# Patient Record
Sex: Male | Born: 1980 | Race: White | Hispanic: No | Marital: Married | Smoking: Former smoker
Health system: Southern US, Community
[De-identification: ages and names within clinical notes are randomized; demographics above are authoritative.]

## PROBLEM LIST (undated history)

## (undated) DIAGNOSIS — I10 Essential (primary) hypertension: Secondary | ICD-10-CM

## (undated) DIAGNOSIS — T7840XA Allergy, unspecified, initial encounter: Secondary | ICD-10-CM

## (undated) DIAGNOSIS — I719 Aortic aneurysm of unspecified site, without rupture: Secondary | ICD-10-CM

## (undated) DIAGNOSIS — F419 Anxiety disorder, unspecified: Secondary | ICD-10-CM

## (undated) DIAGNOSIS — L309 Dermatitis, unspecified: Secondary | ICD-10-CM

## (undated) HISTORY — DX: Dermatitis, unspecified: L30.9

## (undated) HISTORY — DX: Aortic aneurysm of unspecified site, without rupture: I71.9

## (undated) HISTORY — PX: HERNIA REPAIR: SHX51

## (undated) HISTORY — DX: Allergy, unspecified, initial encounter: T78.40XA

---

## 2009-08-10 ENCOUNTER — Emergency Department (HOSPITAL_COMMUNITY): Admission: EM | Admit: 2009-08-10 | Discharge: 2009-08-10 | Payer: Self-pay | Admitting: Emergency Medicine

## 2009-11-04 ENCOUNTER — Encounter (INDEPENDENT_AMBULATORY_CARE_PROVIDER_SITE_OTHER): Payer: Self-pay | Admitting: *Deleted

## 2009-12-28 ENCOUNTER — Encounter (INDEPENDENT_AMBULATORY_CARE_PROVIDER_SITE_OTHER): Payer: Self-pay | Admitting: *Deleted

## 2010-01-19 ENCOUNTER — Telehealth: Payer: Self-pay | Admitting: Gastroenterology

## 2010-01-19 ENCOUNTER — Ambulatory Visit: Payer: Self-pay | Admitting: Gastroenterology

## 2010-01-19 DIAGNOSIS — R112 Nausea with vomiting, unspecified: Secondary | ICD-10-CM | POA: Insufficient documentation

## 2010-01-20 LAB — CONVERTED CEMR LAB
Alkaline Phosphatase: 53 units/L (ref 39–117)
BUN: 10 mg/dL (ref 6–23)
Basophils Relative: 0.4 % (ref 0.0–3.0)
Creatinine, Ser: 0.8 mg/dL (ref 0.4–1.5)
Eosinophils Relative: 4.9 % (ref 0.0–5.0)
Glucose, Bld: 79 mg/dL (ref 70–99)
HCT: 38.4 % — ABNORMAL LOW (ref 39.0–52.0)
Hemoglobin: 13.2 g/dL (ref 13.0–17.0)
Lymphs Abs: 2.5 10*3/uL (ref 0.7–4.0)
MCV: 92 fL (ref 78.0–100.0)
Monocytes Absolute: 0.6 10*3/uL (ref 0.1–1.0)
Neutro Abs: 6.1 10*3/uL (ref 1.4–7.7)
Neutrophils Relative %: 63 % (ref 43.0–77.0)
RBC: 4.17 M/uL — ABNORMAL LOW (ref 4.22–5.81)
Sed Rate: 9 mm/hr (ref 0–22)
Total Bilirubin: 0.7 mg/dL (ref 0.3–1.2)
WBC: 9.7 10*3/uL (ref 4.5–10.5)

## 2010-01-21 ENCOUNTER — Encounter (INDEPENDENT_AMBULATORY_CARE_PROVIDER_SITE_OTHER): Payer: Self-pay | Admitting: *Deleted

## 2010-02-04 ENCOUNTER — Telehealth (INDEPENDENT_AMBULATORY_CARE_PROVIDER_SITE_OTHER): Payer: Self-pay | Admitting: *Deleted

## 2010-02-05 ENCOUNTER — Telehealth (INDEPENDENT_AMBULATORY_CARE_PROVIDER_SITE_OTHER): Payer: Self-pay | Admitting: *Deleted

## 2010-08-25 NOTE — Letter (Signed)
Summary: EGD Instructions  [facility]  [address] [place], [place] [address]   Phone: [phone]  Fax: [phone]       Chad Johnson    [birth year]    MRN: [id number]       Procedure Day /Date:02/04/10  THIURS       Arrival Time: 1030 am     Procedure Time: 1130 am     Location of Procedure:                     [facility] ( Outpatient Registration)    PREPARATION FOR ENDOSCOPY   On 02/04/10  THE DAY OF THE PROCEDURE:  1.   No solid foods, milk or milk products are allowed after midnight the night before your procedure.  2.   Do not drink anything colored red or purple.  Avoid juices with pulp.  No orange juice.  3.  You may drink clear liquids until 730 am , which is 4 hours before your procedure.                                                                                                CLEAR LIQUIDS INCLUDE: Water Jello Ice Popsicles Tea (sugar ok, no milk/cream) Powdered fruit flavored drinks Coffee (sugar ok, no milk/cream) Gatorade Juice: apple, white grape, white cranberry  Lemonade Clear bullion, consomm, broth Carbonated beverages (any kind) Strained chicken noodle soup Hard Candy   MEDICATION INSTRUCTIONS  Unless otherwise instructed, you should take regular prescription medications with a small sip of water as early as possible the morning of your procedure.          OTHER INSTRUCTIONS  You will need a responsible adult at least 31 years of age to accompany you and drive you home.   This person must remain in the waiting room during your procedure.  Wear loose fitting clothing that is easily removed.  Leave jewelry and other valuables at home.  However, you may wish to bring a book to read or an iPod/MP3 player to listen to music as you wait for your procedure to start.  Remove all body piercing jewelry and leave at home.  Total time from sign-in until discharge is approximately 2-3 hours.  You should go  home directly after your procedure and rest.  You can resume normal activities the day after your procedure.  The day of your procedure you should not:   Drive   Make legal decisions   Operate machinery   Drink alcohol   Return to work  You will receive specific instructions about eating, activities and medications before you leave.    The above instructions have been reviewed and explained to me by   Chales Abrahams CMA Duncan Dull)  January 21, 2010 2:33 PM     I fully understand and can verbalize these instructions over the phone mailed to home Date 01/21/10

## 2010-08-25 NOTE — Assessment & Plan Note (Signed)
History of Present Illness Visit Type: consult  Primary GI MD: Rob Bunting MD Primary Provider: Hadley Pen, MD Requesting Provider: Hadley Pen, MD  Chief Complaint: IBS and GERD  History of Present Illness:     very pleasant 30 year old man who has had myriad GI complaints. Alternates between loose stools, constipation.    Wakes up with LUQ pains in the morning.  He eats pretty late in the evening, will eat several times before going to bed.    He had an episode of pyrosis, then nausea, pain substernally, then vomits.  Saw some blood. The episode will last a full evening.     When very young he had a lot of vomiting,  Has taken PPI many years ago.  It never really worked.  He recently tried dexilant for two days, may have caused pains.  Never had EGD.  Sometimes dairy is not a problem, sometimes it is bad.            Current Medications (verified): 1)  None  Allergies (verified): No Known Drug Allergies  Past History:  Past Medical History: Irritable Bowel Syndrome? GERD? 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  All other review of systems was otherwise negative.   Vital Signs:  Patient profile:   30 year old male Height:      74 inches Weight:      155 pounds BMI:     19.97 BSA:     1.95 Pulse rate:   76 / minute Pulse rhythm:   regular BP sitting:   102 / 64  (left arm) Cuff size:   regular  Vitals Entered By: Ok Anis CMA (January 19, 2010 1:55 PM)  Physical Exam  Additional Exam:  Constitutional: generally well appearing, Thin Psychiatric: alert and oriented  times 3 Eyes: extraocular movements intact Mouth: oropharynx moist, no lesions Neck: supple, no lymphadenopathy Cardiovascular: heart regular rate and rythm Lungs: CTA bilaterally Abdomen: soft, non-tender, non-distended, no obvious ascites, no peritoneal signs, normal bowel sounds Extremities: no lower extremity edema bilaterally Skin: no lesions on visible extremities    Impression & Recommendations:  Problem # 1:  myriad upper and lower GI symptoms he is of [employer]. Perhaps this is all from celiac sprue. We will start with blood tests including a CBC, complete metabolic profile, celiac profile, total IgA, sedimentation rate and thyroid testing. If this suggests that he indeed has celiac sprue that he will need upper endoscopy. If it does not then I think he probably still deserves an upper endoscopy as well as a colonoscopy to explain his myriad symptoms.  Other Orders: TLB-CMP (Comprehensive Metabolic Pnl) (80053-COMP) TLB-CBC Platelet - w/Differential (85025-CBCD) TLB-IgA (Immunoglobulin A) (82784-IGA) TLB-Sedimentation Rate (ESR) (85652-ESR) T-Sprue Panel (Celiac Disease Aby Eval) (83516x3/[address])  Patient Instructions: 1)  You will get lab test(s) done today (celiac sprue panel, total IgA, esr, cbc, cmet, tsh). 2)  Will decide on next step based on the lab results. 3)  A copy of this information will be sent to Dr. Sherral Hammers. 4)  The medication list was reviewed and reconciled.  All changed / newly prescribed medications were explained.  A complete medication list was provided to the patient / caregiver.

## 2010-08-25 NOTE — Progress Notes (Signed)
  Phone Note Other Incoming   Summary of Call: Lab from downstairs called to say after having blood drawn Pt. passed out and started to vomit.Discussed with Dr.jacob's who just saw pt. in the office.Per his order lab ntfd. that pt. needs to go to Er. for evaluation. Sheri  from lab called back to say pt. is refusing to go to er. Initial call taken by: Teryl Lucy RN,  January 19, 2010 2:47 PM  Follow-up for Phone Call        ok Follow-up by: Rachael Fee MD,  January 19, 2010 2:54 PM     Appended Document:  have him come upstairs for  vitals check  Appended Document:  Pt. brought to exam room .BP 70/40 [id number].  Appended Document:  his BP was 90/40 not 70/40

## 2010-08-25 NOTE — Letter (Signed)
Summary: New Patient letter  [facility]  [address] [place], [place] [address]   Phone: [phone]  Fax: [phone]       12/28/2009 MRN: [id number]  Chad Johnson [address] [place], [place]  [address]  Dear Mr. Demelo,  Welcome to the Gastroenterology Division at [facility].    You are scheduled to see Dr.  Christella Hartigan on 01-19-10 at 2pm on the 3rd floor at [facility], [address].  We ask that you try to arrive at our office 15 minutes prior to your appointment time to allow for check-in.  We would like you to complete the enclosed self-administered evaluation form prior to your visit and bring it with you on the day of your appointment.  We will review it with you.  Also, please bring a complete list of all your medications or, if you prefer, bring the medication bottles and we will list them.  Please bring your insurance card so that we may make a copy of it.  If your insurance requires a referral to see a specialist, please bring your referral form from your primary care physician.  Co-payments are due at the time of your visit and may be paid by cash, check or credit card.     Your office visit will consist of a consult with your physician (includes a physical exam), any laboratory testing he/she may order, scheduling of any necessary diagnostic testing (e.g. x-ray, ultrasound, CT-scan), and scheduling of a procedure (e.g. Endoscopy, Colonoscopy) if required.  Please allow enough time on your schedule to allow for any/all of these possibilities.    If you cannot keep your appointment, please call [phone] to cancel or reschedule prior to your appointment date.  This allows [place] the opportunity to schedule an appointment for another patient in need of care.  If you do not cancel or reschedule by 5 p.m. the business day prior to your appointment date, you will be charged a $50.00 late cancellation/no-show fee.    Thank you for choosing  [facility] for your medical needs.  We appreciate the opportunity to care for you.  Please visit [place] at our website  to learn more about our practice.                     Sincerely,                                                             The Gastroenterology Division

## 2010-08-25 NOTE — Letter (Signed)
Summary: New Patient letter  [facility]  [address] [place], [place] [address]   Phone: [phone]  Fax: [phone]       11/04/2009 MRN: [id number]  [facility] [address] [place], [place]  [address]  Dear Chad Johnson,  Welcome to the Gastroenterology Division at [facility].    You are scheduled to see Dr.  Rob Bunting on Dec 01, 2009 at  10:00am  on the 3rd floor at [facility], [address].  We ask that you try to arrive at our office 15 minutes prior to your appointment time to allow for check-in.  We would like you to complete the enclosed self-administered evaluation form prior to your visit and bring it with you on the day of your appointment.  We will review it with you.  Also, please bring a complete list of all your medications or, if you prefer, bring the medication bottles and we will list them.  Please bring your insurance card so that we may make a copy of it.  If your insurance requires a referral to see a specialist, please bring your referral form from your primary care physician.  Co-payments are due at the time of your visit and may be paid by cash, check or credit card.     Your office visit will consist of a consult with your physician (includes a physical exam), any laboratory testing he/she may order, scheduling of any necessary diagnostic testing (e.g. x-ray, ultrasound, CT-scan), and scheduling of a procedure (e.g. Endoscopy, Colonoscopy) if required.  Please allow enough time on your schedule to allow for any/all of these possibilities.    If you cannot keep your appointment, please call [phone] to cancel or reschedule prior to your appointment date.  This allows [place] the opportunity to schedule an appointment for another patient in need of care.  If you do not cancel or reschedule by 5 p.m. the business day prior to your appointment date, you will be charged a $50.00 late cancellation/no-show fee.    Thank  you for choosing [facility] for your medical needs.  We appreciate the opportunity to care for you.  Please visit [place] at our website  to learn more about our practice.                     Sincerely,                                                             The Gastroenterology Division

## 2010-08-25 NOTE — Progress Notes (Signed)
Summary: EGD CX  Phone Note Call from Patient   Summary of Call: pt called to cx procedure he says he does not have a ride.  He will call back to reschedule.  I advised him that it is very important that he have this procedure and to call as soon as possible to reschedule.  Endo was called. Initial call taken by: Chales Abrahams CMA Duncan Dull),  February 04, 2010 10:36 AM  Follow-up for Phone Call        ok Follow-up by: Rachael Fee MD,  February 04, 2010 10:53 AM

## 2010-08-25 NOTE — Progress Notes (Signed)
Summary: LAB REMINDER  Phone Note Outgoing Call Call back at [facility] Phone [phone]   Call placed by: Chales Abrahams CMA Duncan Dull),  February 05, 2010 8:28 AM Summary of Call: PT REMINDED TO HAVE LABS AND STOOL TEST Initial call taken by: Chales Abrahams CMA Duncan Dull),  February 05, 2010 8:29 AM

## 2011-09-07 ENCOUNTER — Ambulatory Visit: Payer: Self-pay | Admitting: Family Medicine

## 2011-09-22 ENCOUNTER — Ambulatory Visit: Payer: Self-pay | Admitting: Sports Medicine

## 2011-10-03 ENCOUNTER — Encounter: Payer: Self-pay | Admitting: Family Medicine

## 2011-10-03 ENCOUNTER — Ambulatory Visit (INDEPENDENT_AMBULATORY_CARE_PROVIDER_SITE_OTHER): Payer: Self-pay | Admitting: Family Medicine

## 2011-10-03 DIAGNOSIS — Z13220 Encounter for screening for lipoid disorders: Secondary | ICD-10-CM

## 2011-10-03 DIAGNOSIS — R5383 Other fatigue: Secondary | ICD-10-CM | POA: Insufficient documentation

## 2011-10-03 DIAGNOSIS — M9981 Other biomechanical lesions of cervical region: Secondary | ICD-10-CM

## 2011-10-03 DIAGNOSIS — F411 Generalized anxiety disorder: Secondary | ICD-10-CM

## 2011-10-03 DIAGNOSIS — R5381 Other malaise: Secondary | ICD-10-CM

## 2011-10-03 DIAGNOSIS — M9901 Segmental and somatic dysfunction of cervical region: Secondary | ICD-10-CM

## 2011-10-03 DIAGNOSIS — G8921 Chronic pain due to trauma: Secondary | ICD-10-CM

## 2011-10-03 MED ORDER — HYDROXYZINE HCL 25 MG PO TABS: 25.0000 mg | ORAL_TABLET | Freq: Three times a day (TID) | ORAL | Status: DC | PRN

## 2011-10-03 MED ORDER — HYDROCODONE-ACETAMINOPHEN 5-500 MG PO TABS: 1.0000 | ORAL_TABLET | Freq: Two times a day (BID) | ORAL | Status: AC | PRN

## 2011-10-03 MED ORDER — DIAZEPAM 5 MG PO TABS: 2.5000 mg | ORAL_TABLET | Freq: Two times a day (BID) | ORAL | Status: AC | PRN

## 2011-10-03 MED ORDER — VENLAFAXINE HCL ER 37.5 MG PO CP24: ORAL_CAPSULE | ORAL | Status: DC
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
## 2011-10-03 NOTE — Patient Instructions (Signed)
Very nice to meet you. I'm giving you some hydrocodone and Valium but I hopefully will have the office in the next month or 2. I'm giving you a new medicine called Effexor. Take one pill daily for the first week and 2 pills daily thereafter I'm giving you another medicine called hydroxyzine take one pill up to 3 times a day for anxiety. I would rather use this instead of your Valium when you can. Come back with Toni Amend when you're ready to get your blood drawn. Do the exercises that I should you for your back I when she come back at her scheduled appointment.
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
## 2011-10-03 NOTE — Assessment & Plan Note (Addendum)
After verbal consent pt did have HVLA, with marked improvement.  Gave side effects to look out for and can take anti inflammatories in the acute time frame.  Patient given exercises to try and strengthen the dysfunction he has in his upper back that is causing more strain on his neck. Followup in 2 weeks.

## 2011-10-04 ENCOUNTER — Telehealth: Payer: Self-pay | Admitting: Family Medicine

## 2011-10-04 DIAGNOSIS — G8921 Chronic pain due to trauma: Secondary | ICD-10-CM

## 2011-10-04 DIAGNOSIS — F411 Generalized anxiety disorder: Secondary | ICD-10-CM

## 2011-10-04 NOTE — Telephone Encounter (Signed)
Pt states he cannot afford the Effexor and hydroxyzine - wants to know if there is anything cheaper. [facility] [address]

## 2011-10-05 MED ORDER — CITALOPRAM HYDROBROMIDE 20 MG PO TABS: 20.0000 mg | ORAL_TABLET | Freq: Every day | ORAL | Status: DC

## 2011-10-05 MED ORDER — HYDROXYZINE HCL 25 MG PO TABS: 25.0000 mg | ORAL_TABLET | Freq: Three times a day (TID) | ORAL | Status: AC | PRN

## 2011-10-05 NOTE — Telephone Encounter (Signed)
Hydroxyzine should be $4 so I can not get cheaper than that. I will switch from Effexor to Celexa 20 mg. Please call patient to him to take it daily thank you

## 2011-10-05 NOTE — Telephone Encounter (Signed)
Patient informed that effexor had been switched to celexa, and patient states that hydroxyzine was not $4 at [facility] informed him I would send it to [facility]. Patient expressed understanding.

## 2011-10-06 NOTE — Telephone Encounter (Signed)
Spoke with [employer] @ [facility] [address].  States that hydroxyzine is not on the $4 list.  But she did have a discount card that if applicable would bring the price down to 21.89.  Spoke with pt, he is agreeable to this, will go pick up. Nichlas Pitera, Maryjo [place]

## 2011-10-06 NOTE — Telephone Encounter (Signed)
He went to pick up Hydroxyzine and it was almost $50 - not sure what he should do.  He has to pay out of pocket for all his meds.

## 2011-10-14 ENCOUNTER — Ambulatory Visit: Payer: Self-pay | Admitting: Family Medicine

## 2011-10-24 ENCOUNTER — Encounter: Payer: Self-pay | Admitting: Family Medicine

## 2011-10-24 ENCOUNTER — Ambulatory Visit (INDEPENDENT_AMBULATORY_CARE_PROVIDER_SITE_OTHER): Payer: Self-pay | Admitting: Family Medicine

## 2011-10-24 VITALS — BP 108/72 | HR 70 | Temp 98.2°F | Ht 72.0 in | Wt 182.0 lb

## 2011-10-24 DIAGNOSIS — M899 Disorder of bone, unspecified: Secondary | ICD-10-CM | POA: Insufficient documentation

## 2011-10-24 DIAGNOSIS — M9901 Segmental and somatic dysfunction of cervical region: Secondary | ICD-10-CM

## 2011-10-24 DIAGNOSIS — F411 Generalized anxiety disorder: Secondary | ICD-10-CM

## 2011-10-24 DIAGNOSIS — M9981 Other biomechanical lesions of cervical region: Secondary | ICD-10-CM

## 2011-10-24 DIAGNOSIS — G8921 Chronic pain due to trauma: Secondary | ICD-10-CM

## 2011-10-24 MED ORDER — DIAZEPAM 5 MG PO TABS: 5.0000 mg | ORAL_TABLET | Freq: Two times a day (BID) | ORAL | Status: DC | PRN

## 2011-10-24 MED ORDER — FLUOXETINE HCL (PMDD) 10 MG PO TABS: 1.0000 | ORAL_TABLET | Freq: Every day | ORAL | Status: DC

## 2011-10-24 MED ORDER — CYCLOBENZAPRINE HCL 5 MG PO TABS: 5.0000 mg | ORAL_TABLET | Freq: Three times a day (TID) | ORAL | Status: DC | PRN

## 2011-10-24 MED ORDER — TRAMADOL HCL 50 MG PO TABS: 50.0000 mg | ORAL_TABLET | Freq: Three times a day (TID) | ORAL | Status: AC | PRN

## 2011-10-24 NOTE — Assessment & Plan Note (Signed)
Patient's underlying general anxiety disorder is still causing patient has significant amount of muscle skeletal complaints. Patient does respond to manipulation and we'll continue. In addition to this we will start fluoxetine give Flexeril as needed. Patient will be put on Valium but we'll not give Vicodin instead use tramadol. Patient will followup in 3-4 weeks.
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
## 2011-10-24 NOTE — Assessment & Plan Note (Signed)
As stated above, we will start fluoxetine and we will also use hydroxyzine half a tab for his anxiety. Refilled his Valium but we'll have patient try to take 7.5 mg daily instead of 10 and see if we can continue to decrease the amount. We have a goal of trying to get patient off of Valium by July.

## 2011-10-24 NOTE — Assessment & Plan Note (Signed)
Decision today to treat with OMT was based on Physical Exam  After verbal consent patient was treated with HVLA and articular techniques in cervical and thoracic areas  Patient tolerated the procedure well with improvement in symptoms  Patient given exercises, stretches and lifestyle modifications patient given exercises for scapular dysfunction as well.  See medications in patient instructions if given  Patient will follow up in 4 weeks

## 2011-10-24 NOTE — Patient Instructions (Signed)
Good to see you. I have refilled your medications. We will start a new medicine called fluoxetine. Take one tablet daily. You can start this after finals if you want. I'm giving you some tramadol to try for pain and Valium but I when she to try to decrease the Valium to 7.5 mg daily I'm giving you Flexeril for muscle spasms. I when she to come back in 3-4 weeks to make sure you're improving.    Scapular Winging  with Rehab  Scapular winging syndrome is also known as serratus anterior palsy or long thoracic nerve injury. The condition is an uncommon injury to the nervous system. The condition is caused by injury to the long thoracic nerve that runs through the neck and shoulder. Injury to the shoulder, such as a fall or repetitive stress on the shoulder causes the nerve to become stretched. Occasionally the injury is the result of an infection of the nerve. Damage to the long thoracic nerve results in weakness of the serratus anterior muscle. The serratus anterior muscle is responsible for controlling the shoulder blade (scapula). Weakness in this muscle results in a instability (winging) of the scapula. SYMPTOMS   Pain and weakness in the shoulder (usually the back of the shoulder) that is often diffuse or unable to localize.   Loss of or decrease in shoulder function.   Upper back pain while sitting, due to the scapula pressing on the back of the chair.   Visible deformity in the back of the shoulder.  CAUSES  Scapular winging is caused by stretching of the long thoracic nerve. Common mechanisms of injury include:  Viral illness.   Repetitive and/or stressful use of the shoulder.   Falling onto the shoulder with the head and neck stretched away from the shoulder.  RISK INCREASES WITH:  Contact sports (football, rugby, lacrosse, or soccer).   Activities involving overhead arm movement (baseball, volleyball, or racquet sports).   Poor strength and flexibility.  PREVENTION  Warm up  and stretch properly before activity.   Allow for adequate recovery between workouts.   Maintain physical fitness:   Strength, flexibility, and endurance.   Cardiovascular fitness.   Learn and use proper technique. When possible, have a coach correct improper technique.  PROGNOSIS  Scapular winging normally resolves spontaneously within 18 months. In rare circumstances surgery is recommended.  RELATED COMPLICATIONS   Permanent nerve damage, including pain, numbness, tingle, or weakness.   Shoulder weakness.   Recurrent shoulder pain.   Inability to compete in athletics.  TREATMENT Treatment initially involves resting from any activities that aggravate your symptoms. The use of ice and medication may help reduce pain and inflammation. The use of strengthening and stretching exercises may help reduce pain with activity, specifically shoulder exercises that improve range of motion. These exercises may be performed at home or with referral to a therapist. If symptoms persist for greater than 6 months despite non-surgical (conservative) treatment, then surgery may be recommended. Surgery is only used for the most serious cases and the purpose is to regain function, not to allow an athlete to return to sports. MEDICATION   If pain medication is necessary, then nonsteroidal anti-inflammatory medications, such as aspirin and ibuprofen, or other minor pain relievers, such as acetaminophen, are often recommended.   Do not take pain medication for 7 days before surgery.   Prescription pain relievers may be given if deemed necessary by your caregiver. Use only as directed and only as much as you need.  HEAT AND  COLD  Cold treatment (icing) relieves pain and reduces inflammation. Cold treatment should be applied for 10 to 15 minutes every 2 to 3 hours for inflammation and pain and immediately after any activity that aggravates your symptoms. Use ice packs or massage the area with a piece of ice  (ice massage).   Heat treatment may be used prior to performing the stretching and strengthening activities prescribed by your caregiver, physical therapist, or athletic trainer. Use a heat pack or soak the injury in warm water.  SEEK MEDICAL CARE IF:  Treatment seems to offer no benefit, or the condition worsens.   Any medications produce adverse side effects.  EXERCISES  RANGE OF MOTION (ROM) AND STRETCHING EXERCISES - Scapular Winging (Serratus Anterior Palsy, Long Thoracic Nerve Injury)  These exercises may help you when beginning to rehabilitate your injury. Your symptoms may resolve with or without further involvement from your physician, physical therapist or athletic trainer. While completing these exercises, remember:   Restoring tissue flexibility helps normal motion to return to the joints. This allows healthier, less painful movement and activity.   An effective stretch should be held for at least 30 seconds.   A stretch should never be painful. You should only feel a gentle lengthening or release in the stretched tissue.  ROM - Pendulum  Bend at the waist so that your right / left arm falls away from your body. Support yourself with your opposite hand on a solid surface, such as a table or a countertop.   Your right / left arm should be perpendicular to the ground. If it is not perpendicular, you need to lean over farther. Relax the muscles in your right / left arm and shoulder as much as possible.   Gently sway your hips and trunk so they move your right / left arm without any use of your right / left shoulder muscles.   Progress your movements so that your right / left arm moves side to side, then forward and backward, and finally, both clockwise and counterclockwise.   Complete __________ repetitions in each direction. Many people use this exercise to relieve discomfort in their shoulder as well as to gain range of motion.  Repeat __________ times. Complete this exercise  __________ times per day. STRETCH - Flexion, Seated   Sit in a firm chair so that your right / left forearm can rest on a table or on a table or countertop. Your right / left elbow should rest below the height of your shoulder so that your shoulder feels supported and not tense or uncomfortable.   Keeping your right / left shoulder relaxed, lean forward at your waist, allowing your right / left hand to slide forward. Bend forward until you feel a moderate stretch in your shoulder, but before you feel an increase in your pain.   Hold __________ seconds. Slowly return to your starting position.  Repeat __________ times. Complete this exercise __________ times per day.  STRETCH - Flexion, Standing  Stand with good posture. With an underhand grip on your right / left and an overhand grip on the opposite hand, grasp a broomstick or cane so that your hands are a little more than shoulder-width apart.   Keeping your right / left elbow straight and shoulder muscles relaxed, push the stick with your opposite hand to raise your right / left arm in front of your body and then overhead. Raise your arm until you feel a stretch in your right / left shoulder, but  before you have increased shoulder pain.   Avoid shrugging your right / left shoulder as your arm rises by keeping your shoulder blade tucked down and toward your mid-back spine. Hold __________ seconds.   Slowly return to the starting position.  Repeat __________ times. Complete this exercise __________ times per day. STRETCH - Abduction, Supine  Stand with good posture. With an underhand grip on your right / left and an overhand grip on the opposite hand, grasp a broomstick or cane so that your hands are a little more than shoulder-width apart.   Keeping your right / left elbow straight and shoulder muscles relaxed, push the stick with your opposite hand to raise your right / left arm out to the side of your body and then overhead. Raise your arm  until you feel a stretch in your right / left shoulder, but before you have increased shoulder pain.   Avoid shrugging your right / left shoulder as your arm rises by keeping your shoulder blade tucked down and toward your mid-back spine. Hold __________ seconds.   Slowly return to the starting position.  Repeat __________ times. Complete this exercise __________ times per day. ROM - Flexion, Active-Assisted  Lie on your back. You may bend your knees for comfort.   Grasp a broomstick or cane so your hands are about shoulder-width apart. Your right / left hand should grip the end of the stick/cane so that your hand is positioned "thumbs-up," as if you were about to shake hands.   Using your healthy arm to lead, raise your right / left arm overhead until you feel a gentle stretch in your shoulder. Hold __________ seconds.   Use the stick/cane to assist in returning your right / left arm to its starting position.  Repeat __________ times. Complete this exercise __________ times per day.  STRENGTHENING EXERCISES - Scapular Winging (Serratus Anterior Palsy, Long Thoracic Nerve Injury) These exercises may help you when beginning to rehabilitate your injury. They may resolve your symptoms with or without further involvement from your physician, physical therapist or athletic trainer. While completing these exercises, remember:   Muscles can gain both the endurance and the strength needed for everyday activities through controlled exercises.   Complete these exercises as instructed by your physician, physical therapist or athletic trainer. Progress with the resistance and repetition exercises only as your caregiver advises.   You may experience muscle soreness or fatigue, but the pain or discomfort you are trying to eliminate should never worsen during these exercises. If this pain does worsen, stop and make certain you are following the directions exactly. If the pain is still present after  adjustments, discontinue the exercise until you can discuss the trouble with your clinician.   During your recovery, avoid activity or exercises which involve actions that place your injured hand or elbow above your head or behind your back or head. These positions stress the tissues which are trying to heal.  STRENGTH - Scapular Depression and Adduction   With good posture, sit on a firm chair. Supported your arms in front of you with pillows, arm rests or a table top. Have your elbows in line with the sides of your body.   Gently draw your shoulder blades down and toward your mid-back spine. Gradually increase the tension without tensing the muscles along the top of your shoulders and the back of your neck.   Hold for __________ seconds. Slowly release the tension and relax your muscles completely before completing the next  repetition.   After you have practiced this exercise, remove the arm support and complete it in standing as well as sitting.  Repeat __________ times. Complete this exercise __________ times per day.  STRENGTH - Scapular Protractors, Standing   Stand arms-length away from a wall. Place your hands on the wall, keeping your elbows straight.   Begin by dropping your shoulder blades down and toward your mid-back spine.   To strengthen your protractors, keep your shoulder blades down, but slide them forward on your rib cage. It will feel as if you are lifting the back of your rib cage away from the wall. This is a subtle motion and can be challenging to complete. Ask your clinician for further instruction if you are not sure you are doing the exercise correctly.   Hold for __________ seconds. Slowly return to the starting position, resting the muscles completely before completing the next repetition.  Repeat __________ times. Complete this exercise __________ times per day. STRENGTH - Scapular Protractors, Supine  Lie on your back on a firm surface. Extend your right / left  arm straight into the air while holding a __________ weight in your hand.   Keeping your head and back in place, lift your shoulder off the floor.   Hold __________ seconds. Slowly return to the starting position and allow your muscles to relax completely before completing the next repetition.  Repeat __________ times. Complete this exercise __________ times per day. STRENGTH - Scapular Protractors, Quadruped  Get onto your hands and knees with your shoulders directly over your hands (or as close as you comfortably can be).   Keeping your elbows locked, lift the back of your rib cage up into your shoulder blades so your mid-back rounds-out. Keep your neck muscles relaxed.   Hold this position for __________ seconds. Slowly return to the starting position and allow your muscles to relax completely before completing the next repetition.  Repeat __________ times. Complete this exercise __________ times per day.  STRENGTH - Scapular Depressors  Keeping your feet on the floor, lift your bottom from the seat and lock your elbows.   Keeping your elbows straight, allow gravity to pull your body weight down. Your shoulders will rise toward your ears.   Raise your body against gravity by drawing your shoulder blades down your back, shortening the distance between your shoulders and ears. Although your feet should always maintain contact with the floor, your feet should progressively support less body weight as you get stronger.   Hold __________ seconds. In a controlled and slow manner, lower your body weight to begin the next repetition.  Repeat __________ times. Complete this exercise __________ times per day.  STRENGTH - Shoulder Extensors, Prone  Lie on your stomach on a firm surface so that your right / left arm overhangs the edge. Rest your forehead on your opposite forearm. With your thumb facing away from your body and your elbow straight, hold a __________ weight in your hand.   Squeeze  your right / left shoulder blade to your mid-back spine and then slowly raise your arm behind you to the height of the bed.   Hold for __________ seconds. Slowly reverse the directions and return to the starting position, controlling the weight as you lower your arm.  Repeat __________ times. Complete this exercise __________ times per day.  STRENGTH - Horizontal Abductors Choose one of the two oppositions to complete this exercise. Prone: lying on stomach:  Lie on your stomach on a  firm surface so that your right / left arm overhangs the edge. Rest your forehead on your opposite forearm. With your palm facing the floor and your elbow straight, hold a __________ weight in your hand.   Squeeze your right / left shoulder blade to your mid-back spine and then slowly raise your arm to the height of the bed.   Hold for __________ seconds. Slowly reverse the directions and return to the starting position, controlling the weight as you lower your arm.  Repeat __________ times. Complete this exercise __________ times per day. Standing:  Secure a rubber exercise band/tubing so that it is at the height of your shoulders when you are either standing or sitting on a firm arm-less chair.   Grasp an end of the band/tubing in each hand and have your palms face each other. Straighten your elbows and lift your hands straight in front of you at shoulder height. Step back away from the secured end of band/tubing until it becomes tense.   Squeeze your shoulder blades together. Keeping your elbows locked and your hands at shoulder-height, bring your hands out to your side.   Hold __________ seconds. Slowly ease the tension on the band/tubing as you reverse the directions and return to the starting position.  Repeat __________ times. Complete this exercise __________ times per day. STRENGTH - Scapular Retractors  Secure a rubber exercise band/tubing so that it is at the height of your shoulders when you are  either standing or sitting on a firm arm-less chair.   With a palm-down grip, grasp an end of the band/tubing in each hand. Straighten your elbows and lift your hands straight in front of you at shoulder height. Step back away from the secured end of band/tubing until it becomes tense.   Squeezing your shoulder blades together, draw your elbows back as you bend them. Keep your upper arm lifted away from your body throughout the exercise.   Hold __________ seconds. Slowly ease the tension on the band/tubing as you reverse the directions and return to the starting position.  Repeat __________ times. Complete this exercise __________ times per day. STRENGTH - Shoulder Extensors   Secure a rubber exercise band/tubing so that it is at the height of your shoulders when you are either standing or sitting on a firm arm-less chair.   With a thumbs-up grip, grasp an end of the band/tubing in each hand. Straighten your elbows and lift your hands straight in front of you at shoulder height. Step back away from the secured end of band/tubing until it becomes tense.   Squeezing your shoulder blades together, pull your hands down to the sides of your thighs. Do not allow your hands to go behind you.   Hold for __________ seconds. Slowly ease the tension on the band/tubing as you reverse the directions and return to the starting position.  Repeat __________ times. Complete this exercise __________ times per day.  STRENGTH - Scapular Retractors and External Rotators  Secure a rubber exercise band/tubing so that it is at the height of your shoulders when you are either standing or sitting on a firm arm-less chair.   With a palm-down grip, grasp an end of the band/tubing in each hand. Bend your elbows 90 degrees and lift your elbows to shoulder height at your sides. Step back away from the secured end of band/tubing until it becomes tense.   Squeezing your shoulder blades together, rotate your shoulder so that  your upper arm and elbow remain  stationary, but your fists travel upward to head-height.   Hold __________ for seconds. Slowly ease the tension on the band/tubing as you reverse the directions and return to the starting position.  Repeat __________ times. Complete this exercise __________ times per day.  STRENGTH - Scapular Retractors and External Rotators, Rowing  Secure a rubber exercise band/tubing so that it is at the height of your shoulders when you are either standing or sitting on a firm arm-less chair.   With a palm-down grip, grasp an end of the band/tubing in each hand. Straighten your elbows and lift your hands straight in front of you at shoulder height. Step back away from the secured end of band/tubing until it becomes tense.   Step 1: Squeeze your shoulder blades together. Bending your elbows, draw your hands to your chest as if you are rowing a boat. At the end of this motion, your hands and elbow should be at shoulder-height and your elbows should be out to your sides.   Step 2: Rotate your shoulder to raise your hands above your head. Your forearms should be vertical and your upper-arms should be horizontal.   Hold for __________ seconds. Slowly ease the tension on the band/tubing as you reverse the directions and return to the starting position.  Repeat __________ times. Complete this exercise __________ times per day.  STRENGTH - Scapular Retractors and Elevators  Secure a rubber exercise band/tubing so that it is at the height of your shoulders when you are either standing or sitting on a firm arm-less chair.   With a thumbs-up grip, grasp an end of the band/tubing in each hand. Step back away from the secured end of band/tubing until it becomes tense.   Squeezing your shoulder blades together, straighten your elbows and lift your hands straight over your head.   Hold for __________ seconds. Slowly ease the tension on the band/tubing as you reverse the directions and  return to the starting position.  Repeat __________ times. Complete this exercise __________ times per day.  Document Released: 07/11/2005 Document Revised: 06/30/2011 Document Reviewed: 10/23/2008 [facility] Patient Information 2012 [place], [place].

## 2011-11-25 ENCOUNTER — Other Ambulatory Visit: Payer: Self-pay | Admitting: Family Medicine

## 2011-11-25 NOTE — Telephone Encounter (Signed)
He will need to wait, he was supposed to follow up with me 1 month after his appointment.  I am sorry it is our policy.

## 2011-11-25 NOTE — Telephone Encounter (Signed)
Pt sched appt for f/u on 5/22 - he is out of Diazapam and needs before appt - [facility]- [place].

## 2011-11-28 NOTE — Telephone Encounter (Signed)
Called, no answer and no machine.  Will await call back. Chad Johnson, Chad Johnson

## 2011-12-14 ENCOUNTER — Ambulatory Visit (INDEPENDENT_AMBULATORY_CARE_PROVIDER_SITE_OTHER): Payer: Self-pay | Admitting: Family Medicine

## 2011-12-14 ENCOUNTER — Encounter: Payer: Self-pay | Admitting: Family Medicine

## 2011-12-14 VITALS — BP 103/65 | HR 66 | Temp 98.4°F | Ht 74.0 in | Wt 177.0 lb

## 2011-12-14 DIAGNOSIS — G8921 Chronic pain due to trauma: Secondary | ICD-10-CM

## 2011-12-14 DIAGNOSIS — F411 Generalized anxiety disorder: Secondary | ICD-10-CM

## 2011-12-14 MED ORDER — DIAZEPAM 5 MG PO TABS: 5.0000 mg | ORAL_TABLET | Freq: Two times a day (BID) | ORAL | Status: AC | PRN

## 2011-12-14 MED ORDER — CYCLOBENZAPRINE HCL 5 MG PO TABS: 5.0000 mg | ORAL_TABLET | Freq: Three times a day (TID) | ORAL | Status: DC | PRN

## 2011-12-14 MED ORDER — PREDNISONE 50 MG PO TABS: ORAL_TABLET | ORAL | Status: AC

## 2011-12-14 MED ORDER — IBUPROFEN 600 MG PO TABS: ORAL_TABLET | ORAL | Status: DC

## 2011-12-14 MED ORDER — NORTRIPTYLINE HCL 10 MG PO CAPS: ORAL_CAPSULE | ORAL | Status: DC

## 2011-12-14 NOTE — Assessment & Plan Note (Signed)
Patient's was given Valium again one more time but decreased from 7.5 mg down to 5 mg 2 times a day. We have the plan of decreasing down to 2.5 mg twice a day after this. Patient states he understands this but does not think he will be able to do this. I'm concerned that patient is becoming dependent on this medication. Do think the generalized anxiety disorder and possible depression is causing most problems.

## 2011-12-14 NOTE — Progress Notes (Signed)
  Subjective:    Patient ID: Chad Johnson, male    DOB: 02-06-1981, 31 y.o.   MRN: [id number]  HPI 31 year old male coming back for chronic pain followup. Patient states that he is still having significant amount of pain. Patient has not been doing his exercises on a regular basis and has been doing more activities such as helping a friend move. Patient has not been able to work though he states is secondary to his pain. Patient still states that his pain is mostly in the thoracic lumbar region no radiation down the legs no bowel or bladder incontinence. Patient states that it's worse than usual. Patient states that the Valium does help some but the tramadol does not help at all.  Patient also has generalized anxiety disorder. Patient has been tried on Effexor and citalopram and both had side effects. Patient was given fluoxetine recently any try for 3 days and stopped because it was making him feel too down. Patient denies any suicidal or homicidal ideation but he states that he is a reck. Patient also makes the comment that if I had lived is 30 years I would be a reck as well but he did not want to elaborate.   Review of Systems Denies fever, chills, nausea vomiting abdominal pain, dysuria, chest pain, shortness of breath dyspnea on exertion or numbness in extremities     Objective:   Physical Exam Constitutional: He is oriented to person, place, and time. He appears well-developed.  Eyes: EOM are normal. Pupils are equal, round, and reactive to light.  Neck: Normal range of motion. Neck supple. No thyromegaly present.  Cardiovascular: Normal rate, regular rhythm and normal heart sounds. No murmur heard. Pulmonary/Chest: Effort normal and breath sounds normal.  Abdominal: Soft. Bowel sounds are normal. There is no tenderness.  Neurological: He is alert and oriented to person, place, and time.  Skin: Skin is warm and dry.   OMT Findings: Cervical: C2 rotated and side bent left in  flexion, C5 flex rotated and side bent right Thoracic: T5 extended rotated and side bent right Lumbar: L4 flexed rotated and side bent left Sacrum: Left on left    Assessment & Plan:

## 2011-12-14 NOTE — Patient Instructions (Signed)
I'm giving you a new pill to take at night. The name of it is nortriptyline to make it tired but otherwise should have no other side effects. I refilled your Valium and we will decrease it to 5 mg. I'm hoping we can go down to 2.5 at next visit. I am giving you some ibuprofen. I want you to take one pill 3 times a day for the next 5 days. I'm also giving you prednisone. Take one pill daily for 5 days. I encourage you to do your exercises on a daily basis. If we are still having trouble I think we should send you to physical therapy at your followup.

## 2011-12-14 NOTE — Assessment & Plan Note (Addendum)
Patient continues to have problems I think it's more likely a low related to her depression. We will start nortriptyline and trying hopefully patient will have less side effects of the medication he is taking when he goes to bed. Followup closely in 3 weeks. Gave patient red flags and when to seek medical attention. After verbal consent patient did have some HVLA with minimal improvement. Encourage patient to do daily exercises. Since his at followup he is still having trouble I would like to send him to physical therapy and back school.

## 2011-12-30 ENCOUNTER — Ambulatory Visit: Payer: Self-pay | Admitting: Family Medicine

## 2012-03-06 ENCOUNTER — Encounter: Payer: Self-pay | Admitting: Family Medicine

## 2012-03-06 ENCOUNTER — Ambulatory Visit (INDEPENDENT_AMBULATORY_CARE_PROVIDER_SITE_OTHER): Payer: Self-pay | Admitting: Family Medicine

## 2012-03-06 VITALS — BP 126/79 | HR 83 | Temp 98.7°F | Ht 72.0 in | Wt 166.0 lb

## 2012-03-06 DIAGNOSIS — G8921 Chronic pain due to trauma: Secondary | ICD-10-CM

## 2012-03-06 DIAGNOSIS — F411 Generalized anxiety disorder: Secondary | ICD-10-CM

## 2012-03-06 MED ORDER — MELOXICAM 15 MG PO TABS: 15.0000 mg | ORAL_TABLET | Freq: Every day | ORAL | Status: DC

## 2012-03-06 NOTE — Patient Instructions (Signed)
Nice to meet you today. We would like you to work on obtaining the [employer] so we can refer you to physical therapy. I will send a prescription for antiinflamatory called meloxicam for your neck pain. I would like you to try to set up an appointment with our Behavioral Health clinic for your anxiety. Thanks for your visit.  Back Pain, Adult Low back pain is very common. About 1 in 5 people have back pain.The cause of low back pain is rarely dangerous. The pain often gets better over time.About half of people with a sudden onset of back pain feel better in just 2 weeks. About 8 in 10 people feel better by 6 weeks.  CAUSES Some common causes of back pain include:  Strain of the muscles or ligaments supporting the spine.   Wear and tear (degeneration) of the spinal discs.   Arthritis.   Direct injury to the back.  DIAGNOSIS Most of the time, the direct cause of low back pain is not known.However, back pain can be treated effectively even when the exact cause of the pain is unknown.Answering your caregiver's questions about your overall health and symptoms is one of the most accurate ways to make sure the cause of your pain is not dangerous. If your caregiver needs more information, he or she may order lab work or imaging tests (X-rays or MRIs).However, even if imaging tests show changes in your back, this usually does not require surgery. HOME CARE INSTRUCTIONS For many people, back pain returns.Since low back pain is rarely dangerous, it is often a condition that people can learn to [facility] their own.   Remain active. It is stressful on the back to sit or stand in one place. Do not sit, drive, or stand in one place for more than 30 minutes at a time. Take short walks on level surfaces as soon as pain allows.Try to increase the length of time you walk each day.   Do not stay in bed.Resting more than 1 or 2 days can delay your recovery.   Do not avoid exercise or work.Your  body is made to move.It is not dangerous to be active, even though your back may hurt.Your back will likely heal faster if you return to being active before your pain is gone.   Pay attention to your body when you bend and lift. Many people have less discomfortwhen lifting if they bend their knees, keep the load close to their bodies,and avoid twisting. Often, the most comfortable positions are those that put less stress on your recovering back.   Find a comfortable position to sleep. Use a firm mattress and lie on your side with your knees slightly bent. If you lie on your back, put a pillow under your knees.   Only take over-the-counter or prescription medicines as directed by your caregiver. Over-the-counter medicines to reduce pain and inflammation are often the most helpful.Your caregiver may prescribe muscle relaxant drugs.These medicines help dull your pain so you can more quickly return to your normal activities and healthy exercise.   Put ice on the injured area.   Put ice in a plastic bag.   Place a towel between your skin and the bag.   Leave the ice on for 15 to 20 minutes, 3 to 4 times a day for the first 2 to 3 days. After that, ice and heat may be alternated to reduce pain and spasms.   Ask your caregiver about trying back exercises and gentle massage.  This may be of some benefit.   Avoid feeling anxious or stressed.Stress increases muscle tension and can worsen back pain.It is important to recognize when you are anxious or stressed and learn ways to manage it.Exercise is a great option.  SEEK MEDICAL CARE IF:  You have pain that is not relieved with rest or medicine.   You have pain that does not improve in 1 week.   You have new symptoms.   You are generally not feeling well.  SEEK IMMEDIATE MEDICAL CARE IF:   You have pain that radiates from your back into your legs.   You develop new bowel or bladder control problems.   You have unusual weakness or  numbness in your arms or legs.   You develop nausea or vomiting.   You develop abdominal pain.   You feel faint.  Document Released: 07/11/2005 Document Revised: 06/30/2011 Document Reviewed: 11/29/2010 [facility] Patient Information 2012 [place], [place].  Anxiety and Panic Attacks Your caregiver has informed you that you are having an anxiety or panic attack. There may be many forms of this. Most of the time these attacks come suddenly and without warning. They come at any time of day, including periods of sleep, and at any time of life. They may be strong and unexplained. Although panic attacks are very scary, they are physically harmless. Sometimes the cause of your anxiety is not known. Anxiety is a protective mechanism of the body in its fight or flight mechanism. Most of these perceived danger situations are actually nonphysical situations (such as anxiety over losing a job). CAUSES  The causes of an anxiety or panic attack are many. Panic attacks may occur in otherwise healthy people given a certain set of circumstances. There may be a genetic cause for panic attacks. Some medications may also have anxiety as a side effect. SYMPTOMS  Some of the most common feelings are:  Intense terror.   Dizziness, feeling faint.   Hot and cold flashes.   Fear of going crazy.   Feelings that nothing is real.   Sweating.   Shaking.   Chest pain or a fast heartbeat (palpitations).   Smothering, choking sensations.   Feelings of impending doom and that death is near.   Tingling of extremities, this may be from over-breathing.   Altered reality (derealization).   Being detached from yourself (depersonalization).  Several symptoms can be present to make up anxiety or panic attacks. DIAGNOSIS  The evaluation by your caregiver will depend on the type of symptoms you are experiencing. The diagnosis of anxiety or panic attack is made when no physical illness can be determined to be a cause  of the symptoms. TREATMENT  Treatment to prevent anxiety and panic attacks may include:  Avoidance of circumstances that cause anxiety.   Reassurance and relaxation.   Regular exercise.   Relaxation therapies, such as yoga.   Psychotherapy with a psychiatrist or therapist.   Avoidance of caffeine, alcohol and illegal drugs.   Prescribed medication.  SEEK IMMEDIATE MEDICAL CARE IF:   You experience panic attack symptoms that are different than your usual symptoms.   You have any worsening or concerning symptoms.  Document Released: 07/11/2005 Document Revised: 06/30/2011 Document Reviewed: 11/12/2009 [facility] Patient Information 2012 [place], [place].
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
## 2012-03-06 NOTE — Assessment & Plan Note (Signed)
Patient complains of exacerbation of anxiety disorder related to issues with fiance and her ex-husband.  States currently not taking any medications for this issue.  Offered patient zoloft to treat GAD and he refused saying that this previously made him feel poorly and he wouldn't take it.  Told patient that we would not prescribe valium for this issue and patient states he might just have to find another doctor that could take care of him.  Discussed setting an appointment up with behavioral health clinic.  Patient said this would be something to consider and he would think about it.

## 2012-03-12 ENCOUNTER — Emergency Department (HOSPITAL_COMMUNITY)
Admission: EM | Admit: 2012-03-12 | Discharge: 2012-03-12 | Disposition: A | Discharge status: Home / Self Care | Payer: Self-pay | Attending: Emergency Medicine | Admitting: Emergency Medicine

## 2012-03-12 ENCOUNTER — Emergency Department (HOSPITAL_COMMUNITY): Payer: Self-pay

## 2012-03-12 ENCOUNTER — Encounter (HOSPITAL_COMMUNITY): Payer: Self-pay | Admitting: *Deleted

## 2012-03-12 DIAGNOSIS — E876 Hypokalemia: Secondary | ICD-10-CM | POA: Insufficient documentation

## 2012-03-12 DIAGNOSIS — Z79899 Other long term (current) drug therapy: Secondary | ICD-10-CM | POA: Insufficient documentation

## 2012-03-12 DIAGNOSIS — Z9109 Other allergy status, other than to drugs and biological substances: Secondary | ICD-10-CM | POA: Insufficient documentation

## 2012-03-12 DIAGNOSIS — R109 Unspecified abdominal pain: Secondary | ICD-10-CM | POA: Insufficient documentation

## 2012-03-12 LAB — URINALYSIS, ROUTINE W REFLEX MICROSCOPIC
Glucose, UA: NEGATIVE mg/dL
Leukocytes, UA: NEGATIVE
Protein, ur: NEGATIVE mg/dL
Specific Gravity, Urine: 1.016 (ref 1.005–1.030)

## 2012-03-12 LAB — CBC WITH DIFFERENTIAL/PLATELET
Basophils Absolute: 0 10*3/uL (ref 0.0–0.1)
Basophils Relative: 0 % (ref 0–1)
Eosinophils Absolute: 0.2 10*3/uL (ref 0.0–0.7)
Eosinophils Relative: 1 % (ref 0–5)
HCT: 43 % (ref 39.0–52.0)
MCHC: 36 g/dL (ref 30.0–36.0)
MCV: 87.8 fL (ref 78.0–100.0)
Monocytes Absolute: 0.5 10*3/uL (ref 0.1–1.0)
Platelets: 384 10*3/uL (ref 150–400)
RDW: 13.4 % (ref 11.5–15.5)
WBC: 13.7 10*3/uL — ABNORMAL HIGH (ref 4.0–10.5)

## 2012-03-12 LAB — COMPREHENSIVE METABOLIC PANEL
ALT: 10 U/L (ref 0–53)
AST: 15 U/L (ref 0–37)
Albumin: 5.1 g/dL (ref 3.5–5.2)
Calcium: 10 mg/dL (ref 8.4–10.5)
Creatinine, Ser: 0.75 mg/dL (ref 0.50–1.35)
GFR calc non Af Amer: >90 mL/min (ref >90)
Sodium: 135 mEq/L (ref 135–145)
Total Protein: 7.9 g/dL (ref 6.0–8.3)

## 2012-03-12 MED ORDER — ONDANSETRON HCL 4 MG PO TABS: 4.0000 mg | ORAL_TABLET | Freq: Four times a day (QID) | ORAL | Status: AC

## 2012-03-12 MED ORDER — HYDROMORPHONE HCL PF 1 MG/ML IJ SOLN
1.0000 mg | Freq: Once | INTRAMUSCULAR | Status: AC
  Administered 2012-03-12: 1 mg via INTRAVENOUS
  Filled 2012-03-12: qty 1

## 2012-03-12 MED ORDER — HYDROCODONE-ACETAMINOPHEN 5-325 MG PO TABS: 1.0000 | ORAL_TABLET | ORAL | Status: AC | PRN

## 2012-03-12 MED ORDER — IOHEXOL 300 MG/ML  SOLN
100.0000 mL | Freq: Once | INTRAMUSCULAR | Status: AC | PRN
  Administered 2012-03-12: 100 mL via INTRAVENOUS

## 2012-03-12 MED ORDER — MORPHINE SULFATE 4 MG/ML IJ SOLN
4.0000 mg | Freq: Once | INTRAMUSCULAR | Status: AC
  Administered 2012-03-12: 4 mg via INTRAVENOUS
  Filled 2012-03-12: qty 1

## 2012-03-12 MED ORDER — DICYCLOMINE HCL 20 MG PO TABS: 20.0000 mg | ORAL_TABLET | Freq: Two times a day (BID) | ORAL | Status: DC

## 2012-03-12 MED ORDER — POTASSIUM CHLORIDE 20 MEQ/15ML (10%) PO LIQD
30.0000 meq | Freq: Once | ORAL | Status: AC
  Administered 2012-03-12: 30 meq via ORAL
  Filled 2012-03-12: qty 30

## 2012-03-12 MED ORDER — ONDANSETRON HCL 4 MG/2ML IJ SOLN
4.0000 mg | Freq: Once | INTRAMUSCULAR | Status: AC
  Administered 2012-03-12: 4 mg via INTRAVENOUS
  Filled 2012-03-12: qty 2

## 2012-03-12 MED ORDER — SODIUM CHLORIDE 0.9 % IV BOLUS (SEPSIS)
1000.0000 mL | Freq: Once | INTRAVENOUS | Status: AC
  Administered 2012-03-12: 1000 mL via INTRAVENOUS

## 2012-03-12 NOTE — ED Notes (Signed)
Pt taken to CT.

## 2012-03-12 NOTE — ED Notes (Signed)
Pt aware a urine sample is needed. Pt has urinal at bedside

## 2012-03-12 NOTE — ED Notes (Signed)
Pt's fiance reports pt has been having severe LLQ pain since Friday, pt reports pain became severe last night.  Pt's Fiance reports that pt's pain would be very severe that pt "passes out" and then comes to and is disoriented.  Pt is presently hyperventilating.  Pt reports nausea.

## 2012-03-12 NOTE — ED Notes (Signed)
Pt reports left lower abdominal pain without radiation, sharp and intermittent starting two days ago and becoming worse last night. Pt reports nausea and constipation, but states he had a BM this AM that was hard. Pt denies vomiting, painful or frequent urination or penile discharge.  Pt reports history of abdominal pain and states his MD was never able to diagnose the cause of this pain.  Pt reports pain worsens with movement and standing.

## 2012-03-12 NOTE — ED Notes (Signed)
Pt has finished contrast.  

## 2012-03-12 NOTE — ED Notes (Signed)
Patient given contrast to drink by CT
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
## 2012-03-12 NOTE — ED Notes (Signed)
MD made aware pt reports pain medication not effective.

## 2012-08-31 ENCOUNTER — Emergency Department (HOSPITAL_COMMUNITY)
Admission: EM | Admit: 2012-08-31 | Discharge: 2012-08-31 | Disposition: A | Discharge status: Home / Self Care | Payer: Self-pay | Attending: Emergency Medicine | Admitting: Emergency Medicine

## 2012-08-31 ENCOUNTER — Emergency Department (HOSPITAL_COMMUNITY): Payer: Self-pay

## 2012-08-31 ENCOUNTER — Encounter (HOSPITAL_COMMUNITY): Payer: Self-pay | Admitting: *Deleted

## 2012-08-31 DIAGNOSIS — Z79899 Other long term (current) drug therapy: Secondary | ICD-10-CM | POA: Insufficient documentation

## 2012-08-31 DIAGNOSIS — R110 Nausea: Secondary | ICD-10-CM | POA: Insufficient documentation

## 2012-08-31 DIAGNOSIS — R1032 Left lower quadrant pain: Secondary | ICD-10-CM | POA: Insufficient documentation

## 2012-08-31 DIAGNOSIS — R109 Unspecified abdominal pain: Secondary | ICD-10-CM

## 2012-08-31 LAB — CBC WITH DIFFERENTIAL/PLATELET
Basophils Absolute: 0.1 10*3/uL (ref 0.0–0.1)
HCT: 43.3 % (ref 39.0–52.0)
Lymphocytes Relative: 30 % (ref 12–46)
Lymphs Abs: 3.2 10*3/uL (ref 0.7–4.0)
Monocytes Absolute: 0.6 10*3/uL (ref 0.1–1.0)
Neutro Abs: 6.3 10*3/uL (ref 1.7–7.7)
Platelets: 425 10*3/uL — ABNORMAL HIGH (ref 150–400)
RBC: 4.96 MIL/uL (ref 4.22–5.81)
RDW: 13.1 % (ref 11.5–15.5)
WBC: 10.6 10*3/uL — ABNORMAL HIGH (ref 4.0–10.5)

## 2012-08-31 LAB — COMPREHENSIVE METABOLIC PANEL
ALT: 15 U/L (ref 0–53)
AST: 23 U/L (ref 0–37)
CO2: 22 mEq/L (ref 19–32)
Chloride: 103 mEq/L (ref 96–112)
GFR calc non Af Amer: >90 mL/min (ref >90)
Sodium: 137 mEq/L (ref 135–145)
Total Bilirubin: 0.3 mg/dL (ref 0.3–1.2)

## 2012-08-31 LAB — URINALYSIS, ROUTINE W REFLEX MICROSCOPIC
Bilirubin Urine: NEGATIVE
Glucose, UA: NEGATIVE mg/dL
Hgb urine dipstick: NEGATIVE
Ketones, ur: NEGATIVE mg/dL
Protein, ur: NEGATIVE mg/dL

## 2012-08-31 MED ORDER — ONDANSETRON HCL 4 MG/2ML IJ SOLN
4.0000 mg | Freq: Once | INTRAMUSCULAR | Status: AC
  Administered 2012-08-31: 4 mg via INTRAVENOUS
  Filled 2012-08-31: qty 2

## 2012-08-31 MED ORDER — HYDROMORPHONE HCL PF 1 MG/ML IJ SOLN
1.0000 mg | Freq: Once | INTRAMUSCULAR | Status: AC
  Administered 2012-08-31: 1 mg via INTRAVENOUS
  Filled 2012-08-31: qty 1

## 2012-08-31 MED ORDER — ONDANSETRON HCL 4 MG/2ML IJ SOLN: 4.0000 mg | Freq: Once | INTRAMUSCULAR | Status: DC

## 2012-08-31 MED ORDER — HYDROMORPHONE HCL PF 1 MG/ML IJ SOLN: 1.0000 mg | Freq: Once | INTRAMUSCULAR | Status: DC

## 2012-08-31 MED ORDER — IOHEXOL 300 MG/ML  SOLN
100.0000 mL | Freq: Once | INTRAMUSCULAR | Status: AC | PRN
  Administered 2012-08-31: 100 mL via INTRAVENOUS

## 2012-08-31 MED ORDER — IOHEXOL 300 MG/ML  SOLN
50.0000 mL | Freq: Once | INTRAMUSCULAR | Status: AC | PRN
  Administered 2012-08-31: 50 mL via ORAL

## 2012-08-31 MED ORDER — FENTANYL CITRATE 0.05 MG/ML IJ SOLN
50.0000 ug | Freq: Once | INTRAMUSCULAR | Status: AC
  Administered 2012-08-31: 50 ug via INTRAVENOUS
  Filled 2012-08-31: qty 2

## 2012-08-31 MED ORDER — PROMETHAZINE HCL 25 MG PO TABS: 25.0000 mg | ORAL_TABLET | Freq: Four times a day (QID) | ORAL | Status: DC | PRN

## 2012-08-31 MED ORDER — OXYCODONE-ACETAMINOPHEN 5-325 MG PO TABS: 2.0000 | ORAL_TABLET | Freq: Four times a day (QID) | ORAL | Status: DC | PRN
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
## 2012-08-31 NOTE — ED Notes (Signed)
Pt aware of the need for a urine sample, urinal at bedside. Pt requesting pain medication, RN Workman aware.

## 2012-08-31 NOTE — ED Notes (Signed)
Pt sts last time this happened he took (2) 250mg  flagyl tablets from his mother in law and this helped his pain. Zammit, EDP informed and updated on pt's pain of 6/10. No new orders received at this time. Will continue to monitor.

## 2012-08-31 NOTE — ED Notes (Signed)
XBJ:[id number]<NF> Expected date:08/31/12<BR> Expected time: 8:56 AM<BR> Means of arrival:<BR> Comments:<BR>

## 2012-08-31 NOTE — ED Notes (Signed)
Pt reports L sided abd pain, sudden onset approx 0700 this am. "Feels like there is a meat grinder in my side." +n/v x2. Denies urinary symptoms. Was trying to have a BM this am when started. Unable to have BM.

## 2012-08-31 NOTE — ED Notes (Signed)
[phone] Toni Amend, pt's wife leaving to pick up kids. Requests call back when pt is discharged.

## 2012-09-03 ENCOUNTER — Emergency Department (HOSPITAL_COMMUNITY)
Admission: EM | Admit: 2012-09-03 | Discharge: 2012-09-03 | Disposition: A | Discharge status: Home / Self Care | Payer: Self-pay | Attending: Emergency Medicine | Admitting: Emergency Medicine

## 2012-09-03 ENCOUNTER — Encounter (HOSPITAL_COMMUNITY): Payer: Self-pay | Admitting: Emergency Medicine

## 2012-09-03 ENCOUNTER — Emergency Department (HOSPITAL_COMMUNITY)
Admission: EM | Admit: 2012-09-03 | Discharge: 2012-09-03 | Discharge status: Against Medical Advice | Payer: Self-pay | Attending: Emergency Medicine | Admitting: Emergency Medicine

## 2012-09-03 DIAGNOSIS — R109 Unspecified abdominal pain: Secondary | ICD-10-CM | POA: Insufficient documentation

## 2012-09-03 DIAGNOSIS — N390 Urinary tract infection, site not specified: Secondary | ICD-10-CM | POA: Insufficient documentation

## 2012-09-03 DIAGNOSIS — R1110 Vomiting, unspecified: Secondary | ICD-10-CM | POA: Insufficient documentation

## 2012-09-03 DIAGNOSIS — R110 Nausea: Secondary | ICD-10-CM | POA: Insufficient documentation

## 2012-09-03 DIAGNOSIS — Z87828 Personal history of other (healed) physical injury and trauma: Secondary | ICD-10-CM | POA: Insufficient documentation

## 2012-09-03 LAB — CBC WITH DIFFERENTIAL/PLATELET
Basophils Absolute: 0.1 10*3/uL (ref 0.0–0.1)
Basophils Relative: 1 % (ref 0–1)
Eosinophils Relative: 1 % (ref 0–5)
HCT: 42.3 % (ref 39.0–52.0)
Hemoglobin: 14.9 g/dL (ref 13.0–17.0)
Lymphocytes Relative: 11 % — ABNORMAL LOW (ref 12–46)
MCHC: 35.2 g/dL (ref 30.0–36.0)
MCV: 87 fL (ref 78.0–100.0)
Monocytes Absolute: 0.4 10*3/uL (ref 0.1–1.0)
Monocytes Relative: 3 % (ref 3–12)
RDW: 12.8 % (ref 11.5–15.5)

## 2012-09-03 LAB — BASIC METABOLIC PANEL
BUN: 8 mg/dL (ref 6–23)
CO2: 23 mEq/L (ref 19–32)
Calcium: 9.5 mg/dL (ref 8.4–10.5)
Chloride: 103 mEq/L (ref 96–112)
Creatinine, Ser: 0.7 mg/dL (ref 0.50–1.35)

## 2012-09-03 LAB — HEPATIC FUNCTION PANEL
ALT: 15 U/L (ref 0–53)
Alkaline Phosphatase: 62 U/L (ref 39–117)
Bilirubin, Direct: 0.1 mg/dL (ref 0.0–0.3)
Indirect Bilirubin: 0.4 mg/dL (ref 0.3–0.9)
Total Bilirubin: 0.5 mg/dL (ref 0.3–1.2)
Total Protein: 7.5 g/dL (ref 6.0–8.3)

## 2012-09-03 LAB — URINALYSIS, ROUTINE W REFLEX MICROSCOPIC
Bilirubin Urine: NEGATIVE
Ketones, ur: >80 mg/dL — AB
Nitrite: NEGATIVE
Protein, ur: 30 mg/dL — AB
Urobilinogen, UA: 1 mg/dL (ref 0.0–1.0)
pH: 8.5 — ABNORMAL HIGH (ref 5.0–8.0)

## 2012-09-03 MED ORDER — ONDANSETRON HCL 4 MG/2ML IJ SOLN: 4.0000 mg | Freq: Once | INTRAMUSCULAR | Status: DC

## 2012-09-03 MED ORDER — HYDROMORPHONE HCL PF 2 MG/ML IJ SOLN
2.0000 mg | Freq: Once | INTRAMUSCULAR | Status: AC
  Administered 2012-09-03: 2 mg via INTRAMUSCULAR
  Filled 2012-09-03: qty 1

## 2012-09-03 MED ORDER — PROMETHAZINE HCL 25 MG PO TABS: 25.0000 mg | ORAL_TABLET | Freq: Four times a day (QID) | ORAL | Status: DC | PRN

## 2012-09-03 MED ORDER — PROMETHAZINE HCL 25 MG PO TABS
25.0000 mg | ORAL_TABLET | Freq: Once | ORAL | Status: AC
  Administered 2012-09-03: 25 mg via ORAL
  Filled 2012-09-03: qty 1

## 2012-09-03 MED ORDER — OXYCODONE-ACETAMINOPHEN 5-325 MG PO TABS: 2.0000 | ORAL_TABLET | ORAL | Status: DC | PRN

## 2012-09-03 MED ORDER — KETOROLAC TROMETHAMINE 60 MG/2ML IM SOLN
60.0000 mg | Freq: Once | INTRAMUSCULAR | Status: AC
  Administered 2012-09-03: 60 mg via INTRAMUSCULAR
  Filled 2012-09-03: qty 2

## 2012-09-03 MED ORDER — HYDROMORPHONE HCL PF 2 MG/ML IJ SOLN: 2.0000 mg | Freq: Once | INTRAMUSCULAR | Status: DC

## 2012-09-03 MED ORDER — SODIUM CHLORIDE 0.9 % IV BOLUS (SEPSIS): 1000.0000 mL | Freq: Once | INTRAVENOUS | Status: DC

## 2012-09-03 MED ORDER — SULFAMETHOXAZOLE-TRIMETHOPRIM 800-160 MG PO TABS: 1.0000 | ORAL_TABLET | Freq: Two times a day (BID) | ORAL | Status: AC

## 2012-09-03 MED ORDER — ONDANSETRON 8 MG PO TBDP
8.0000 mg | ORAL_TABLET | Freq: Once | ORAL | Status: AC
  Administered 2012-09-03: 8 mg via ORAL
  Filled 2012-09-03: qty 1

## 2012-09-03 MED ORDER — PROMETHAZINE HCL 25 MG/ML IJ SOLN
25.0000 mg | Freq: Once | INTRAMUSCULAR | Status: AC
  Administered 2012-09-03: 25 mg via INTRAMUSCULAR
  Filled 2012-09-03 (×2): qty 1

## 2012-09-03 MED ORDER — HYDROMORPHONE HCL 2 MG PO TABS
4.0000 mg | ORAL_TABLET | Freq: Once | ORAL | Status: AC
  Administered 2012-09-03: 4 mg via ORAL
  Filled 2012-09-03: qty 2

## 2012-09-03 NOTE — ED Notes (Signed)
Urine noted to be cold when given to RN by family from pt. Checked by second Charity fundraiser.

## 2012-09-03 NOTE — ED Notes (Signed)
Per EMS pt was lying in the floor of his apt hyperventilating complaining of left side abd pain that he was seen for on Friday. Scans negative.

## 2012-09-03 NOTE — ED Notes (Signed)
Pt left AMA and decided to return to the ER while in the hallway. Pt continued to be belligerent and use foul language.

## 2012-09-03 NOTE — ED Notes (Signed)
JYN:[id number]<AO> Expected date:09/03/12<BR> Expected time: 9:42 AM<BR> Means of arrival:Ambulance<BR> Comments:<BR> 31yo m- n/v
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
## 2012-09-03 NOTE — ED Notes (Signed)
Prescriptions were filled.

## 2012-09-03 NOTE — ED Notes (Signed)
Security present with RN at bedside due to pt pushing attempts to assess him. Security and RN advised that his assessment had to be completed in order to gain insight on what is going on with him. Pt continues to use foul language and thrash in the bed.

## 2012-09-03 NOTE — ED Notes (Signed)
Pt states that he will leave.

## 2012-09-03 NOTE — ED Notes (Signed)
Pt is very uncooperative with staff. Refuses to answer questions. Rolls around in the bed with foul language.

## 2012-09-03 NOTE — ED Notes (Signed)
Voiced understanding of instructions given 

## 2012-09-03 NOTE — ED Notes (Signed)
Medications given waiting for registration to re-register pt due to [facility].

## 2012-09-04 NOTE — ED Provider Notes (Signed)
Medical screening examination/treatment/procedure(s) were conducted as a shared visit with non-physician practitioner(s) and myself.  I personally evaluated the patient during the encounter.  No acute abdomen.  Recent CT scan negative. Minor urinary infection noted. Patient understands to return to the hospital ASAP if symptoms worse  Donnetta Hutching, MD 09/04/12 [phone]

## 2012-09-09 ENCOUNTER — Encounter (HOSPITAL_COMMUNITY): Payer: Self-pay | Admitting: Emergency Medicine

## 2012-09-09 ENCOUNTER — Emergency Department (HOSPITAL_COMMUNITY)
Admission: EM | Admit: 2012-09-09 | Discharge: 2012-09-09 | Disposition: A | Discharge status: Home / Self Care | Payer: Self-pay | Attending: Emergency Medicine | Admitting: Emergency Medicine

## 2012-09-09 DIAGNOSIS — R1012 Left upper quadrant pain: Secondary | ICD-10-CM | POA: Insufficient documentation

## 2012-09-09 DIAGNOSIS — R109 Unspecified abdominal pain: Secondary | ICD-10-CM

## 2012-09-09 DIAGNOSIS — R197 Diarrhea, unspecified: Secondary | ICD-10-CM | POA: Insufficient documentation

## 2012-09-09 DIAGNOSIS — R112 Nausea with vomiting, unspecified: Secondary | ICD-10-CM | POA: Insufficient documentation

## 2012-09-09 DIAGNOSIS — F1210 Cannabis abuse, uncomplicated: Secondary | ICD-10-CM | POA: Insufficient documentation

## 2012-09-09 LAB — COMPREHENSIVE METABOLIC PANEL
ALT: 14 U/L (ref 0–53)
Alkaline Phosphatase: 62 U/L (ref 39–117)
BUN: 9 mg/dL (ref 6–23)
CO2: 22 mEq/L (ref 19–32)
Calcium: 9.5 mg/dL (ref 8.4–10.5)
GFR calc Af Amer: >90 mL/min (ref >90)
GFR calc non Af Amer: >90 mL/min (ref >90)
Glucose, Bld: 135 mg/dL — ABNORMAL HIGH (ref 70–99)
Sodium: 143 mEq/L (ref 135–145)
Total Protein: 7.8 g/dL (ref 6.0–8.3)

## 2012-09-09 LAB — URINALYSIS, ROUTINE W REFLEX MICROSCOPIC
Bilirubin Urine: NEGATIVE
Hgb urine dipstick: NEGATIVE
Nitrite: NEGATIVE
Protein, ur: NEGATIVE mg/dL
Urobilinogen, UA: 1 mg/dL (ref 0.0–1.0)

## 2012-09-09 LAB — CBC WITH DIFFERENTIAL/PLATELET
Eosinophils Absolute: 0 10*3/uL (ref 0.0–0.7)
Eosinophils Relative: 0 % (ref 0–5)
HCT: 39.9 % (ref 39.0–52.0)
Hemoglobin: 14.3 g/dL (ref 13.0–17.0)
Lymphocytes Relative: 11 % — ABNORMAL LOW (ref 12–46)
Lymphs Abs: 1.1 10*3/uL (ref 0.7–4.0)
MCH: 30.8 pg (ref 26.0–34.0)
MCV: 85.8 fL (ref 78.0–100.0)
Monocytes Relative: 3 % (ref 3–12)
Platelets: 352 10*3/uL (ref 150–400)
RBC: 4.65 MIL/uL (ref 4.22–5.81)
WBC: 9.9 10*3/uL (ref 4.0–10.5)

## 2012-09-09 LAB — RAPID URINE DRUG SCREEN, HOSP PERFORMED
Barbiturates: NOT DETECTED
Tetrahydrocannabinol: POSITIVE — AB

## 2012-09-09 LAB — LIPASE, BLOOD: Lipase: 27 U/L (ref 11–59)

## 2012-09-09 MED ORDER — HYDROMORPHONE HCL PF 1 MG/ML IJ SOLN
0.5000 mg | Freq: Once | INTRAMUSCULAR | Status: AC
  Administered 2012-09-09: 0.5 mg via INTRAVENOUS
  Filled 2012-09-09: qty 1

## 2012-09-09 MED ORDER — PROMETHAZINE HCL 25 MG RE SUPP: 25.0000 mg | Freq: Four times a day (QID) | RECTAL | Status: DC | PRN

## 2012-09-09 MED ORDER — HYDROMORPHONE HCL PF 1 MG/ML IJ SOLN
1.0000 mg | Freq: Once | INTRAMUSCULAR | Status: AC
  Administered 2012-09-09: 1 mg via INTRAVENOUS
  Filled 2012-09-09: qty 1

## 2012-09-09 MED ORDER — ONDANSETRON HCL 4 MG/2ML IJ SOLN: 4.0000 mg | Freq: Once | INTRAMUSCULAR | Status: AC

## 2012-09-09 MED ORDER — OXYCODONE-ACETAMINOPHEN 7.5-325 MG PO TABS: 1.0000 | ORAL_TABLET | ORAL | Status: DC | PRN

## 2012-09-09 MED ORDER — HYDROMORPHONE HCL PF 1 MG/ML IJ SOLN
1.0000 mg | Freq: Once | INTRAMUSCULAR | Status: AC
  Administered 2012-09-09: 1 mg via INTRAVENOUS

## 2012-09-09 MED ORDER — ONDANSETRON HCL 4 MG/2ML IJ SOLN
4.0000 mg | Freq: Once | INTRAMUSCULAR | Status: AC
  Administered 2012-09-09 (×2): 4 mg via INTRAVENOUS

## 2012-09-09 MED ORDER — ONDANSETRON HCL 4 MG/2ML IJ SOLN
INTRAMUSCULAR | Status: AC
  Filled 2012-09-09: qty 2

## 2012-09-09 MED ORDER — HYDROMORPHONE HCL PF 1 MG/ML IJ SOLN
INTRAMUSCULAR | Status: AC
  Filled 2012-09-09: qty 1

## 2012-09-09 MED ORDER — ONDANSETRON HCL 4 MG/2ML IJ SOLN
INTRAMUSCULAR | Status: AC
  Administered 2012-09-09: 4 mg via INTRAVENOUS
  Filled 2012-09-09: qty 2

## 2012-09-09 NOTE — ED Notes (Signed)
Pt presents to ED via EMS with c/o n/v/d.

## 2012-09-09 NOTE — ED Notes (Signed)
Pt medicated for abdominal pain. Will monitor until ready for discharge. Vital signs stable.
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
## 2012-09-09 NOTE — ED Notes (Signed)
Bowel sounds hypoactive. Medicated for pain

## 2012-09-10 NOTE — ED Provider Notes (Signed)
Medical screening examination/treatment/procedure(s) were performed by non-physician practitioner and as supervising physician I was immediately available for consultation/collaboration.  Tobin Chad, MD 09/10/12 [phone]

## 2012-10-02 ENCOUNTER — Emergency Department (HOSPITAL_COMMUNITY): Payer: Self-pay

## 2012-10-02 ENCOUNTER — Emergency Department (HOSPITAL_COMMUNITY)
Admission: EM | Admit: 2012-10-02 | Discharge: 2012-10-02 | Disposition: A | Discharge status: Home / Self Care | Payer: Self-pay | Attending: Emergency Medicine | Admitting: Emergency Medicine

## 2012-10-02 ENCOUNTER — Encounter (HOSPITAL_COMMUNITY): Payer: Self-pay | Admitting: General Practice

## 2012-10-02 DIAGNOSIS — Z87828 Personal history of other (healed) physical injury and trauma: Secondary | ICD-10-CM | POA: Insufficient documentation

## 2012-10-02 DIAGNOSIS — S79919A Unspecified injury of unspecified hip, initial encounter: Secondary | ICD-10-CM | POA: Insufficient documentation

## 2012-10-02 DIAGNOSIS — Y9301 Activity, walking, marching and hiking: Secondary | ICD-10-CM | POA: Insufficient documentation

## 2012-10-02 DIAGNOSIS — X500XXA Overexertion from strenuous movement or load, initial encounter: Secondary | ICD-10-CM | POA: Insufficient documentation

## 2012-10-02 DIAGNOSIS — Y929 Unspecified place or not applicable: Secondary | ICD-10-CM | POA: Insufficient documentation

## 2012-10-02 DIAGNOSIS — M25552 Pain in left hip: Secondary | ICD-10-CM

## 2012-10-02 DIAGNOSIS — S79929A Unspecified injury of unspecified thigh, initial encounter: Secondary | ICD-10-CM | POA: Insufficient documentation

## 2012-10-02 MED ORDER — IBUPROFEN 800 MG PO TABS: 800.0000 mg | ORAL_TABLET | Freq: Three times a day (TID) | ORAL | Status: DC

## 2012-10-02 MED ORDER — CYCLOBENZAPRINE HCL 10 MG PO TABS: 10.0000 mg | ORAL_TABLET | Freq: Two times a day (BID) | ORAL | Status: DC | PRN

## 2012-10-02 MED ORDER — OXYCODONE-ACETAMINOPHEN 5-325 MG PO TABS
2.0000 | ORAL_TABLET | Freq: Once | ORAL | Status: AC
  Administered 2012-10-02: 2 via ORAL
  Filled 2012-10-02: qty 2

## 2012-10-02 MED ORDER — OXYCODONE-ACETAMINOPHEN 5-325 MG PO TABS: 1.0000 | ORAL_TABLET | ORAL | Status: DC | PRN

## 2012-10-02 NOTE — ED Notes (Signed)
Pt back from radiology 

## 2012-10-02 NOTE — ED Provider Notes (Signed)
Medical screening examination/treatment/procedure(s) were performed by non-physician practitioner and as supervising physician I was immediately available for consultation/collaboration.   Alan Davidson III, MD 10/02/12 1958 

## 2012-10-02 NOTE — ED Notes (Signed)
Pt states that he went to step over something, and when he lifted his leg he heard a pop and felt a pain in his left hip. Pain caused him to collapse. PMS intact
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
## 2014-01-23 ENCOUNTER — Emergency Department (HOSPITAL_COMMUNITY)
Admission: EM | Admit: 2014-01-23 | Discharge: 2014-01-23 | Disposition: A | Discharge status: Home / Self Care | Payer: Self-pay | Attending: Emergency Medicine | Admitting: Emergency Medicine

## 2014-01-23 ENCOUNTER — Emergency Department (HOSPITAL_COMMUNITY): Payer: Self-pay

## 2014-01-23 ENCOUNTER — Encounter (HOSPITAL_COMMUNITY): Payer: Self-pay | Admitting: Emergency Medicine

## 2014-01-23 DIAGNOSIS — R1084 Generalized abdominal pain: Secondary | ICD-10-CM | POA: Insufficient documentation

## 2014-01-23 DIAGNOSIS — Z791 Long term (current) use of non-steroidal anti-inflammatories (NSAID): Secondary | ICD-10-CM | POA: Insufficient documentation

## 2014-01-23 DIAGNOSIS — Z79899 Other long term (current) drug therapy: Secondary | ICD-10-CM | POA: Insufficient documentation

## 2014-01-23 DIAGNOSIS — R112 Nausea with vomiting, unspecified: Secondary | ICD-10-CM | POA: Insufficient documentation

## 2014-01-23 DIAGNOSIS — Z87828 Personal history of other (healed) physical injury and trauma: Secondary | ICD-10-CM | POA: Insufficient documentation

## 2014-01-23 DIAGNOSIS — R1012 Left upper quadrant pain: Secondary | ICD-10-CM | POA: Insufficient documentation

## 2014-01-23 LAB — COMPREHENSIVE METABOLIC PANEL
ALBUMIN: 5 g/dL (ref 3.5–5.2)
ALK PHOS: 69 U/L (ref 39–117)
ALT: 11 U/L (ref 0–53)
ANION GAP: 17 — AB (ref 5–15)
AST: 18 U/L (ref 0–37)
BILIRUBIN TOTAL: 0.6 mg/dL (ref 0.3–1.2)
BUN: 9 mg/dL (ref 6–23)
CHLORIDE: 102 meq/L (ref 96–112)
CO2: 23 mEq/L (ref 19–32)
Calcium: 9.9 mg/dL (ref 8.4–10.5)
Creatinine, Ser: 0.7 mg/dL (ref 0.50–1.35)
GFR calc Af Amer: >90 mL/min (ref >90)
GFR calc non Af Amer: >90 mL/min (ref >90)
Glucose, Bld: 104 mg/dL — ABNORMAL HIGH (ref 70–99)
POTASSIUM: 3.7 meq/L (ref 3.7–5.3)
SODIUM: 142 meq/L (ref 137–147)
TOTAL PROTEIN: 8.2 g/dL (ref 6.0–8.3)

## 2014-01-23 LAB — CBC WITH DIFFERENTIAL/PLATELET
BASOS PCT: 1 % (ref 0–1)
Basophils Absolute: 0.1 10*3/uL (ref 0.0–0.1)
EOS ABS: 0.3 10*3/uL (ref 0.0–0.7)
Eosinophils Relative: 2 % (ref 0–5)
HCT: 42.6 % (ref 39.0–52.0)
HEMOGLOBIN: 15.2 g/dL (ref 13.0–17.0)
LYMPHS ABS: 1.9 10*3/uL (ref 0.7–4.0)
Lymphocytes Relative: 15 % (ref 12–46)
MCH: 31 pg (ref 26.0–34.0)
MCHC: 35.7 g/dL (ref 30.0–36.0)
MCV: 86.9 fL (ref 78.0–100.0)
MONOS PCT: 4 % (ref 3–12)
Monocytes Absolute: 0.5 10*3/uL (ref 0.1–1.0)
NEUTROS ABS: 9.9 10*3/uL — AB (ref 1.7–7.7)
NEUTROS PCT: 78 % — AB (ref 43–77)
PLATELETS: 369 10*3/uL (ref 150–400)
RBC: 4.9 MIL/uL (ref 4.22–5.81)
RDW: 13 % (ref 11.5–15.5)
WBC: 12.6 10*3/uL — ABNORMAL HIGH (ref 4.0–10.5)

## 2014-01-23 LAB — LIPASE, BLOOD: Lipase: 27 U/L (ref 11–59)

## 2014-01-23 LAB — I-STAT CG4 LACTIC ACID, ED: Lactic Acid, Venous: 2.96 mmol/L — ABNORMAL HIGH (ref 0.5–2.2)

## 2014-01-23 MED ORDER — IOHEXOL 300 MG/ML  SOLN
50.0000 mL | Freq: Once | INTRAMUSCULAR | Status: AC | PRN
  Administered 2014-01-23: 50 mL via ORAL

## 2014-01-23 MED ORDER — OXYCODONE-ACETAMINOPHEN 5-325 MG PO TABS
2.0000 | ORAL_TABLET | Freq: Once | ORAL | Status: AC
Start: 2014-01-23 — End: 2014-01-23
  Administered 2014-01-23: 2 via ORAL
  Filled 2014-01-23: qty 2

## 2014-01-23 MED ORDER — SODIUM CHLORIDE 0.9 % IV BOLUS (SEPSIS)
1000.0000 mL | Freq: Once | INTRAVENOUS | Status: AC
  Administered 2014-01-23: 1000 mL via INTRAVENOUS

## 2014-01-23 MED ORDER — FENTANYL CITRATE 0.05 MG/ML IJ SOLN
50.0000 ug | Freq: Once | INTRAMUSCULAR | Status: AC
  Administered 2014-01-23: 50 ug via INTRAVENOUS
  Filled 2014-01-23: qty 2

## 2014-01-23 MED ORDER — ONDANSETRON 8 MG PO TBDP
8.0000 mg | ORAL_TABLET | Freq: Once | ORAL | Status: AC
  Administered 2014-01-23: 8 mg via ORAL
  Filled 2014-01-23: qty 1

## 2014-01-23 MED ORDER — PROMETHAZINE HCL 25 MG PO TABS: 25.0000 mg | ORAL_TABLET | Freq: Four times a day (QID) | ORAL | Status: DC | PRN

## 2014-01-23 MED ORDER — HYDROCODONE-ACETAMINOPHEN 5-325 MG PO TABS: 1.0000 | ORAL_TABLET | Freq: Four times a day (QID) | ORAL | Status: DC | PRN

## 2014-01-23 MED ORDER — IOHEXOL 300 MG/ML  SOLN
100.0000 mL | Freq: Once | INTRAMUSCULAR | Status: AC | PRN
  Administered 2014-01-23: 100 mL via INTRAVENOUS

## 2014-01-23 MED ORDER — ONDANSETRON HCL 4 MG/2ML IJ SOLN
4.0000 mg | Freq: Once | INTRAMUSCULAR | Status: AC
  Administered 2014-01-23: 4 mg via INTRAVENOUS
  Filled 2014-01-23: qty 2

## 2014-01-23 MED ORDER — PROMETHAZINE HCL 25 MG RE SUPP: 25.0000 mg | Freq: Four times a day (QID) | RECTAL | Status: DC | PRN

## 2014-01-23 NOTE — Discharge Instructions (Signed)
We saw you in the ER for the abdominal pain. All of our results are normal, including all labs and imaging. Kidney function is fine as well. We are not sure what is causing your abdominal pain, and recommend that you see your primary care doctor within 2-3 days for further evaluation. If you dont have a primary care doctor - see the GI doctor as requested. If your symptoms get worse, return to the ER. Take the pain meds and nausea meds as prescribed.   RESOURCE GUIDE  Chronic Pain Problems: Contact [facility]  [phone] Patients need to be referred by their primary care doctor.  Insufficient Money for Medicine: Contact [employer]:  call "211."   No Primary Care Doctor: - Call Health Connect  [phone] - can help you locate a primary care doctor that  accepts your insurance, provides certain services, etc. - Physician Referral Service- [phone]  Agencies that provide inexpensive medical care: - [facility]  [facility]  [phone] - [facility]  [phone] - [facility]  [phone] - [facility]  [facility]  [phone]  [place] Providers: - [facility]- [address], [address]  [phone], Mon-Fri 9am-7pm, Sat 9am-1pm - [facility]- [place], [address] [place]  [place], [address] [place]  [place]- [address]  [place], [address], [phone]  Only accepts [place] patients after they have their name  applied to their card  Self Pay (no insurance) in [place]: - Sickle Cell Patients: Dr Kevan Ny, [facility]  [place], [facility]- [place]  [facility] [place]-  [address], [facility]- see information above (Speak to [employer] if you do not have insurance)       -  [facility]- [place],  [address], [place]  Dr Vista Lawman-  [address], [place], [place], [place]       -  [facility] and [facility] - [address] [phone]       -  [facility]- [address], [place], also [address], [phone]       -    [facility]- [place], [place], 1st & 3rd Saturday        every month, 10am-1pm  [facility] [place], [place] [address] [phone]  [facility] [place]. [place],  [address] [phone]  1) Find a Doctor and Pay Out of Pocket Although you won't have to find out who is covered by your insurance plan, it is a good idea to ask around and get recommendations. You will then need to call the office and see if the doctor you have chosen will accept you as a new patient and what types of options they offer for patients who are self-pay. Some doctors offer discounts  or will set up payment plans for their patients who do not have insurance, but you will need to ask so you aren't surprised when you get to your appointment.  2) Contact Your Local Health Department Not all health departments have doctors that can see patients for sick visits, but many do, so it is worth a call to see if yours does. If you don't know where your local health department is, you can check in your phone book. The CDC also has a tool to help you locate your state's health department, and many state websites also have listings of all of their local health departments.  3) Find a [facility] If your illness is not likely to be very severe or complicated, you may want to try a walk in clinic. These are popping up all over the  country in pharmacies, drugstores, and shopping centers. They're usually staffed by nurse practitioners or physician assistants that have been trained to treat common illnesses and complaints. They're usually fairly quick and inexpensive. However, if you have serious medical issues or chronic medical problems, these are probably not your best option  STD Testing - [place], [facility], [address] [place], phone [phone] or [phone].  Monday - Friday, call for an appointment. - [place], [facility], [address], [place], phone [phone] or [phone].  Monday - Friday, call for an appointment.  Abuse/Neglect: - [place] [phone] - [place] [phone] (After Hours)  Emergency Shelter:  [facility] [phone]  Maternity Homes: - [facility] [place] [phone] - [place] [phone]  MRSA Hotline #:   865-020-9030  Dental Assistance If unable to pay or uninsured, contact:  [facility]. to become qualified for the adult dental clinic.  Patients with Medicaid: [facility] [phone] [address], [place] [address] [phone]  If unable to pay, or uninsured, contact [facility] [phone] in [place], [place] in [facility]) to become qualified for the adult dental clinic  [facility] [address] [place], [place] [address] [phone] [email or web address]  Other [place]: - [facility]- [place], [place], [place], [address], [place], 2nd and 4th Thursday of the month at 6:30am.  10 clients each day by appointment, can sometimes see walk-in patients if someone does not show for an appointment. [facility]- [address] [place], [place], [address], [place], [place], [place], [address], [place] Department- [place] [phone]           Abdominal Pain Many things can cause abdominal pain. Usually, abdominal pain is not caused by a disease and will improve without treatment. It can often be observed and treated at home. Your health care provider will do a physical exam and possibly order blood tests and X-rays to help determine the seriousness of your pain. However, in many cases, more time must pass before a clear cause of the pain can be found. Before that point, your health care provider may not know if you need more testing or further treatment. HOME CARE INSTRUCTIONS  Monitor your abdominal pain for any changes. The following actions may help to alleviate  any discomfort you are experiencing:  Only take over-the-counter or prescription medicines as directed by your health care provider.  Do not take laxatives unless directed to do so by your health care provider.  Try a clear liquid diet (broth, tea, or water) as directed by your health care provider. Slowly move to a bland diet as tolerated. SEEK MEDICAL CARE IF:  You have unexplained abdominal pain.  You have abdominal pain associated with nausea or diarrhea.  You have pain when you urinate or have a bowel movement.  You experience abdominal pain that wakes you in the night.  You have abdominal pain that is worsened or improved by eating food.  You have abdominal pain that is worsened with eating fatty foods.  You have a fever. SEEK IMMEDIATE MEDICAL CARE IF:   Your pain does not go away within 2 hours.  You keep throwing up (vomiting).  Your pain is felt only in portions of the abdomen, such as the right side or the left lower portion of the abdomen.  You pass bloody  or black tarry stools. MAKE SURE YOU:  Understand these instructions.   Will watch your condition.   Will get help right away if you are not doing well or get worse.  Document Released: 04/20/2005 Document Revised: 07/16/2013 Document Reviewed: 03/20/2013 [facility] Patient Information 2015 [place], [place]. This information is not intended to replace advice given to you by your health care provider. Make sure you discuss any questions you have with your health care provider.
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
## 2014-01-23 NOTE — Progress Notes (Signed)
[facility] did not get to see patient but will be sending information about [facility], using the address provided.

## 2014-01-23 NOTE — ED Notes (Signed)
Pt c/o left sided abd pain onset midnight with multiple episodes of emesis. Active heaving in triage. Small amount of diarrhea.

## 2014-02-28 ENCOUNTER — Ambulatory Visit (INDEPENDENT_AMBULATORY_CARE_PROVIDER_SITE_OTHER): Payer: Self-pay | Admitting: Family Medicine

## 2014-02-28 VITALS — BP 100/80 | Temp 98.2°F | Wt 157.8 lb

## 2014-02-28 DIAGNOSIS — J0100 Acute maxillary sinusitis, unspecified: Secondary | ICD-10-CM

## 2014-02-28 DIAGNOSIS — J019 Acute sinusitis, unspecified: Secondary | ICD-10-CM

## 2014-02-28 MED ORDER — FLUTICASONE PROPIONATE 50 MCG/ACT NA SUSP: 2.0000 | Freq: Every day | NASAL | Status: DC

## 2014-02-28 MED ORDER — AMOXICILLIN 500 MG PO CAPS: 500.0000 mg | ORAL_CAPSULE | Freq: Two times a day (BID) | ORAL | Status: DC

## 2014-02-28 MED ORDER — FEXOFENADINE HCL 30 MG PO TABS: 60.0000 mg | ORAL_TABLET | Freq: Two times a day (BID) | ORAL | Status: DC

## 2014-02-28 NOTE — Patient Instructions (Signed)
Thank you for coming in,   You may take flonase 2 puffs twice a day for two weeks then back down to once daily. I also sent allegra as well.   Afrin can be purchased OTC but only used for three days.   I sent in amoxicillin to use for 7 days.   Please follow up if you are not getting any better.    Please feel free to call with any questions or concerns at any time, at [phone]. --Dr. Raeford Razor

## 2014-02-28 NOTE — Progress Notes (Deleted)
   Subjective:    Patient ID: Chad Johnson, male    DOB: 1981-01-20, 33 y.o.   MRN: [id number]  HPI  Chad Johnson is here for ***.   Cough, post nasal drip, congestion.  Throat hurting  No fevers, chills, night sweats.  Has been going on for about a month.  Felt like he got a little better then became worse again  Had a similar event about a year ago.  Has associated headache.  No itchy, watery eeyes.  Some sneeinzing  Some sinus pressure.   Current Outpatient Prescriptions on File Prior to Visit  Medication Sig Dispense Refill  . guaiFENesin (MUCINEX) 600 MG 12 hr tablet Take 600 mg by mouth 2 (two) times daily as needed for to loosen phlegm.      Marland Kitchen HYDROcodone-acetaminophen (NORCO/VICODIN) 5-325 MG per tablet Take 1 tablet by mouth every 6 (six) hours as needed.  15 tablet  0  . ibuprofen (ADVIL,MOTRIN) 200 MG tablet Take 400-600 mg by mouth every 6 (six) hours as needed.      . promethazine (PHENERGAN) 25 MG suppository Place 1 suppository (25 mg total) rectally every 6 (six) hours as needed for nausea.  12 each  0  . promethazine (PHENERGAN) 25 MG tablet Take 1 tablet (25 mg total) by mouth every 6 (six) hours as needed for nausea.  30 tablet  0   No current facility-administered medications on file prior to visit.    Review of Systems See HPI     Objective:   Physical Exam        Assessment & Plan:

## 2014-02-28 NOTE — Progress Notes (Signed)
   Subjective:    Patient ID: Chad Johnson, male    DOB: 1981-06-17, 33 y.o.   MRN: [id number]  HPI  Chad Johnson is here for sinus headache, congestion and allergies.   He presents with a month long history of sinus congestion, post nasal drip and itchy/watery eyes. He seemed to get better a few weeks ago and then became worse again. He denies any fever, chills, or night sweats. No cough. He denies any sick contacts. He has some sore throat associated with the post nasal drip. He has associated frontal headache. Some sinus pressure.    Current Outpatient Prescriptions on File Prior to Visit  Medication Sig Dispense Refill  . guaiFENesin (MUCINEX) 600 MG 12 hr tablet Take 600 mg by mouth 2 (two) times daily as needed for to loosen phlegm.      Marland Kitchen HYDROcodone-acetaminophen (NORCO/VICODIN) 5-325 MG per tablet Take 1 tablet by mouth every 6 (six) hours as needed.  15 tablet  0  . ibuprofen (ADVIL,MOTRIN) 200 MG tablet Take 400-600 mg by mouth every 6 (six) hours as needed.      . promethazine (PHENERGAN) 25 MG suppository Place 1 suppository (25 mg total) rectally every 6 (six) hours as needed for nausea.  12 each  0  . promethazine (PHENERGAN) 25 MG tablet Take 1 tablet (25 mg total) by mouth every 6 (six) hours as needed for nausea.  30 tablet  0   No current facility-administered medications on file prior to visit.     Review of Systems See HPI     Objective:   Physical Exam BP 100/80  Temp(Src) 98.2 F (36.8 C) (Oral)  Wt 157 lb 12.8 oz (71.578 kg) Gen: NAD, alert, cooperative with exam,  HEENT: NCAT, PERRL, clear conjunctiva, oropharynx clear, supple neck, TM's clear and intact, no anterior or posterior LAD, Turbinates are erythematous and edematous. Maxillary and Frontal sinus pressure to palpation  Resp: CTABL, no wheezes, non-labored      Assessment & Plan:

## 2014-03-02 ENCOUNTER — Encounter: Payer: Self-pay | Admitting: Family Medicine

## 2014-03-02 DIAGNOSIS — J019 Acute sinusitis, unspecified: Secondary | ICD-10-CM | POA: Insufficient documentation

## 2014-03-02 NOTE — Assessment & Plan Note (Signed)
Most likely sinusitis. Could be contributed with allergies.  - flonase 2 puffs BID for two weeks then once daily after that  - allegra started  - Afrin for three days  - can also use nasal saline rinse, throat lozenges, gargle with salt water or swallow honey  - f/u if not improving.

## 2014-03-10 ENCOUNTER — Ambulatory Visit: Payer: Self-pay | Admitting: Family Medicine

## 2014-03-17 ENCOUNTER — Encounter: Payer: Self-pay | Admitting: Family Medicine

## 2014-03-17 ENCOUNTER — Other Ambulatory Visit: Payer: Self-pay | Admitting: Family Medicine

## 2014-03-17 ENCOUNTER — Ambulatory Visit (INDEPENDENT_AMBULATORY_CARE_PROVIDER_SITE_OTHER): Payer: Self-pay | Admitting: Family Medicine

## 2014-03-17 VITALS — BP 115/79 | HR 76 | Temp 97.7°F | Ht 74.0 in | Wt 159.6 lb

## 2014-03-17 DIAGNOSIS — J0100 Acute maxillary sinusitis, unspecified: Secondary | ICD-10-CM

## 2014-03-17 MED ORDER — CIPROFLOXACIN HCL 500 MG PO TABS: 500.0000 mg | ORAL_TABLET | Freq: Two times a day (BID) | ORAL | Status: DC

## 2014-03-17 MED ORDER — AMOXICILLIN-POT CLAVULANATE 875-125 MG PO TABS: 1.0000 | ORAL_TABLET | Freq: Two times a day (BID) | ORAL | Status: DC

## 2014-03-17 NOTE — Patient Instructions (Addendum)
Amoxicillin; Clavulanic Acid chewable tablets °What is this medicine? °AMOXICILLIN; CLAVULANIC ACID (a mox i SIL in; KLAV yoo lan ic AS id) is a penicillin antibiotic. It is used to treat certain kinds of bacterial infections. It It will not work for colds, flu, or other viral infections. °This medicine may be used for other purposes; ask your health care provider or pharmacist if you have questions. °COMMON BRAND NAME(S): Augmentin °What should I tell my health care provider before I take this medicine? °They need to know if you have any of these conditions: °-bowel disease, like colitis °-kidney disease °-liver disease °-mononucleosis °-phenylketonuria °-an unusual or allergic reaction to amoxicillin, penicillin, cephalosporin, other antibiotics, clavulanic acid, other medicines, foods, dyes, or preservatives °-pregnant or trying to get pregnant °-breast-feeding °How should I use this medicine? °Take this medicine by mouth. Chew it completely before swallowing. Follow the directions on the prescription label. Take this medicine at the start of a meal or snack. Take your medicine at regular intervals. Do not take your medicine more often than directed. Take all of your medicine as directed even if you think you are better. Do not skip doses or stop your medicine early. °Talk to your pediatrician regarding the use of this medicine in children. While this drug may be prescribed for selected conditions, precautions do apply. °Overdosage: If you think you have taken too much of this medicine contact a poison control center or emergency room at once. °NOTE: This medicine is only for you. Do not share this medicine with others. °What if I miss a dose? °If you miss a dose, take it as soon as you can. If it is almost time for your next dose, take only that dose. Do not take double or extra doses. °What may interact with this medicine? °-allopurinol °-anticoagulants °-birth control pills °-methotrexate °-probenecid °This  list may not describe all possible interactions. Give your health care provider a list of all the medicines, herbs, non-prescription drugs, or dietary supplements you use. Also tell them if you smoke, drink alcohol, or use illegal drugs. Some items may interact with your medicine. °What should I watch for while using this medicine? °Tell your doctor or health care professional if your symptoms do not improve. °Do not treat diarrhea with over the counter products. Contact your doctor if you have diarrhea that lasts more than 2 days or if it is severe and watery. °If you have diabetes, you may get a false-positive result for sugar in your urine. Check with your doctor or health care professional. °Birth control pills may not work properly while you are taking this medicine. Talk to your doctor about using an extra method of birth control. °What side effects may I notice from receiving this medicine? °Side effects that you should report to your doctor or health care professional as soon as possible: °-allergic reactions like skin rash, itching or hives, swelling of the face, lips, or tongue °-breathing problems °-dark urine °-fever or chills, sore throat °-redness, blistering, peeling or loosening of the skin, including inside the mouth °-seizures °-trouble passing urine or change in the amount of urine °-unusual bleeding, bruising °-unusually weak or tired °-white patches or sores in the mouth or throat °Side effects that usually do not require medical attention (report to your doctor or health care professional if they continue or are bothersome): °-diarrhea °-dizziness °-headache °-nausea, vomiting °-stomach upset °-vaginal or anal irritation °This list may not describe all possible side effects. Call your doctor for medical advice   about side effects. You may report side effects to FDA at 1-800-FDA-1088. Where should I keep my medicine? Keep out of the reach of children. Store at room temperature below 25 degrees  C (77 degrees F). Keep container tightly closed. Throw away any unused medicine after the expiration date. NOTE: This sheet is a summary. It may not cover all possible information. If you have questions about this medicine, talk to your doctor, pharmacist, or health care provider.  2015, [employer]vier/[employer] Standard. (2007-10-04 11:38:22)   Please start taking lactobacillus tablets (Probiotics) to help with your stomach.  You may take Tylenol 650 mg three to four times per day for pain.  As well, start using the nasal saline and flonase.  Thanks, Dr. Awanda Mink

## 2014-03-17 NOTE — Progress Notes (Signed)
  Subjective:     Chad Johnson is a 33 y.o. male who presents for evaluation of sinus pain. Symptoms include: congestion, facial pain and headaches. Onset of symptoms was 2 weeks ago. Symptoms have been unchanged since that time. Past history is significant for no history of pneumonia or bronchitis. Patient is a non-smoker.  Recently tx with amoxicillin but had no relief.  As well, Rx Flonase which he did not pick up because he has a hx of Migraines and thought this might be a trigger.  He has taken the allegra and afrin.    The following portions of the patient's history were reviewed and updated as appropriate: allergies, current medications, past family history, past medical history, past social history, past surgical history and problem list.  Review of Systems Pertinent items are noted in HPI.   Objective:    BP 115/79  Pulse 76  Temp(Src) 97.7 F (36.5 C) (Oral)  Ht 6\' 2"  (1.88 m)  Wt 159 lb 9.6 oz (72.394 kg)  BMI 20.48 kg/m2 General appearance: alert, cooperative and appears stated age Head: Normocephalic, without obvious abnormality, atraumatic Eyes: conjunctivae/corneas clear. PERRL, EOM's intact. Fundi benign. Ears: normal TM's and external ear canals both ears Nose: Nasal edema and erythema + maxillary/frontal TTP  Throat: lips, mucosa, and tongue normal; teeth and gums normal Neck: no adenopathy, thyroid: normal to inspection and palpation and thyroid not enlarged, symmetric, no tenderness/mass/nodules    Assessment:    Acute bacterial rhinosinusitis.    Plan:    Nasal saline sprays. Nasal steroids per medication orders. Augmentin per medication orders. Follow up in 10 days or as needed.  Continue with Mucinex/Allegra and recommend trying the flonase x 1-2 days As well, start lactobacillus and nasal saline. May need CT head and ENT referral if no improvement with augmentin (x 14 days)

## 2014-07-01 ENCOUNTER — Ambulatory Visit (INDEPENDENT_AMBULATORY_CARE_PROVIDER_SITE_OTHER): Payer: Self-pay | Admitting: Family Medicine

## 2014-07-01 ENCOUNTER — Telehealth: Payer: Self-pay | Admitting: Family Medicine

## 2014-07-01 ENCOUNTER — Encounter: Payer: Self-pay | Admitting: Family Medicine

## 2014-07-01 VITALS — BP 137/75 | HR 89 | Temp 98.0°F | Ht 73.0 in | Wt 158.6 lb

## 2014-07-01 DIAGNOSIS — J329 Chronic sinusitis, unspecified: Secondary | ICD-10-CM

## 2014-07-01 MED ORDER — AMOXICILLIN-POT CLAVULANATE 875-125 MG PO TABS: 1.0000 | ORAL_TABLET | Freq: Two times a day (BID) | ORAL | Status: DC

## 2014-07-01 NOTE — Telephone Encounter (Signed)
Rx for augmentin re-sent to [facility] on [address]. Augmentin is the best thing for this. Please inform patient.  [name], MD

## 2014-07-01 NOTE — Progress Notes (Signed)
Patient ID: Chad Johnson, male   DOB: 1980/07/27, 33 y.o.   MRN: [id number]  HPI:  Pt presents for a same day appointment to discuss URI symptoms.  Patient states that he has been sick now for about 4 weeks. He got sick, then got better one week later. Now is sick again for the last two weeks. Has sinus congestion, also pain in R ear. No fever that he knows of. His children were sick recently as well. Has tried dayquil, nyquil, sudafed pressure & pain, sudafed mucous, and airborne. Does not do flonase regularly. Has had occasional cough which is worse at night. Decreased appetite but able to eat and drink.   ROS: See HPI  Chad Johnson: hx GAD, recent tx for sinusitis. otherwise unremarkable  PHYSICAL EXAM: BP 137/75 mmHg  Pulse 89  Temp(Src) 98 F (36.7 C) (Oral)  Ht 6\' 1"  (1.854 m)  Wt 158 lb 9.6 oz (71.94 kg)  BMI 20.93 kg/m2 Gen: NAD, appears mildly unwell HEENT: NCAT. MMM. Oropharynx clear and moist without lesions. R TM erythematous with slight bulging. L TM normal. Mild sinus tenderness throughout. Heart: RRR no murmurs Lungs: CTAB, NWOB Neuro: grossly nonfocal, speech normal, interactive, ambulates normally  ASSESSMENT/PLAN:  1. Recurrent sinusitis: has been treated multiple times over the last few months for sinusitis, which has been recurrent. This latest bout has lasted about two weeks.  -I will treat him with augmentin for sinusitis and what I suspect is mild R otitis media. -Refer to ENT for recurrent sinusitis -check HIV and CBC with diff to eval for blood dyscrasia or immunocompromise given recurrent nature. Had planned to get these this morning but pt states he will return another time for them as he will "pass out" during venipuncture and he drove himself here.  FOLLOW UP: F/u as needed if symptoms worsen or do not improve.  Return for lab visit. Referring to ENT  Chad Johnson. [name], [name]

## 2014-07-01 NOTE — Telephone Encounter (Signed)
Spoke with patient and informed him that rx has been sent

## 2014-07-01 NOTE — Patient Instructions (Signed)
I am referring you to ENT for your recurrent sinus infections. You will get a phone call to schedule this appointment.  Checking some bloodwork. Take augmentin twice a day for 10 days to treat ear infection and sinus infection.  Follow up if worsening or not improving.  Happy Holidays!  Dr. [name]

## 2014-07-01 NOTE — Telephone Encounter (Signed)
Will forward to MD seen today.

## 2014-07-01 NOTE — Telephone Encounter (Signed)
Pt called and wanted to know if we can call in his medication to [facility] on [address]. Also is Cipro cheaper or better than the Amoxicillin if so can we call that instead. jw

## 2014-07-08 ENCOUNTER — Telehealth: Payer: Self-pay | Admitting: Family Medicine

## 2014-07-08 NOTE — Telephone Encounter (Signed)
Pt states that about 3 days ago and when he blew his nose he felt "a suction cup feeling".  There is still some pain from the right ear.  Not sure if this is part of the process.  The "sounds and feeling from blowing a congested nose is how the ear sounds and feels." Will forward to Dr. [name] who saw patient for this problem. Jazmin Hartsell,CMA

## 2014-07-08 NOTE — Telephone Encounter (Signed)
Pt called because he has a cold and was seen last week and given antibiotics. He has not finished them yet but he said that something is wrong with his ear. He doesn't know if this is a new symptom or a side effect from the medication. Please call to discuss. jw

## 2014-07-09 NOTE — Telephone Encounter (Signed)
Pt is aware and made an appt for tomorrow at 11a. Zophia Marrone,CMA

## 2014-07-09 NOTE — Telephone Encounter (Signed)
I am not sure what is causing his ear to feel this way. We are treating him for sinusitis and possibly an ear infection. Recommend he returns for an office visit to have it evaluated. [name], MD

## 2014-07-10 ENCOUNTER — Encounter: Payer: Self-pay | Admitting: Family Medicine

## 2014-07-10 ENCOUNTER — Ambulatory Visit (INDEPENDENT_AMBULATORY_CARE_PROVIDER_SITE_OTHER): Payer: Self-pay | Admitting: Family Medicine

## 2014-07-10 VITALS — BP 137/92 | HR 73 | Temp 98.1°F | Wt 159.0 lb

## 2014-07-10 DIAGNOSIS — H6981 Other specified disorders of Eustachian tube, right ear: Secondary | ICD-10-CM

## 2014-07-10 DIAGNOSIS — J0100 Acute maxillary sinusitis, unspecified: Secondary | ICD-10-CM

## 2014-07-10 DIAGNOSIS — J019 Acute sinusitis, unspecified: Secondary | ICD-10-CM

## 2014-07-10 MED ORDER — CETIRIZINE HCL 10 MG PO TABS: 10.0000 mg | ORAL_TABLET | Freq: Every day | ORAL | Status: DC

## 2014-07-10 MED ORDER — FLUTICASONE PROPIONATE 50 MCG/ACT NA SUSP: 2.0000 | Freq: Every day | NASAL | Status: DC

## 2014-07-10 MED ORDER — NAPROXEN 500 MG PO TABS: 500.0000 mg | ORAL_TABLET | Freq: Two times a day (BID) | ORAL | Status: DC

## 2014-07-10 NOTE — Progress Notes (Signed)
    Subjective   Chad Johnson is a 33 y.o. male that presents for a same day visit  1. Ear pain: Symptoms started 6 days ago. Worse when blowing his nose. Associated wheezing, productive cough. Chest pain with coughing. Some shortness of breath. No history of lung disease. Quit smoking 4 years ago. Smoked about 1PPD for 15 years prior. Sick contacts include fiance who has the flu. Taking dayquil and niquil. Drinking [place] which helps.   History  Substance Use Topics  . Smoking status: Never Smoker   . Smokeless tobacco: Not on file  . Alcohol Use: No    ROS Per HPI  Objective   BP 137/92 mmHg  Pulse 73  Temp(Src) 98.1 F (36.7 C) (Oral)  Wt 159 lb (72.122 kg)  General: Fair appearing male HEENT: Left TM and ear canal look normal. Right TM erythematous. No purulence noted. Some tenderness along tragus. Maxillary or frontal sinus tenderness, mild edema of nasal turbinates, oropharynx is clear. Respiratory/Chest: Clear to auscultation bilaterally  Assessment and Plan   Please refer to problem based charting of assessment and plan

## 2014-07-10 NOTE — Patient Instructions (Addendum)
Thank you for coming to see me today. It was a pleasure. Today we talked about:   Ear pain: this is most likely related to your ear duct. I'm prescribing zyrtec, Flonase and naproxen to help with your symptoms. Please follow-up with the ENT doctor.  If you have any questions or concerns, please do not hesitate to call the office at [phone].  Sincerely,  Cordelia Poche, MD

## 2014-07-20 DIAGNOSIS — H6990 Unspecified Eustachian tube disorder, unspecified ear: Secondary | ICD-10-CM | POA: Insufficient documentation

## 2014-07-20 DIAGNOSIS — H6980 Other specified disorders of Eustachian tube, unspecified ear: Secondary | ICD-10-CM | POA: Insufficient documentation

## 2014-07-20 NOTE — Assessment & Plan Note (Signed)
Patient seen multiple times for ear problems, including an otitis media. Patient requesting ENT referral today. Does not appear to have an acute infection  Referral to ENT  Encourage usage of Flonase and Zyrtec to hopefully help with symptoms  Naproxen for pain/discomfort

## 2017-08-19 ENCOUNTER — Encounter (HOSPITAL_COMMUNITY): Payer: Self-pay | Admitting: Emergency Medicine

## 2017-08-19 ENCOUNTER — Emergency Department (HOSPITAL_COMMUNITY)
Admission: EM | Admit: 2017-08-19 | Discharge: 2017-08-19 | Disposition: A | Discharge status: Home / Self Care | Payer: Self-pay | Attending: Emergency Medicine | Admitting: Emergency Medicine

## 2017-08-19 DIAGNOSIS — J111 Influenza due to unidentified influenza virus with other respiratory manifestations: Secondary | ICD-10-CM | POA: Insufficient documentation

## 2017-08-19 DIAGNOSIS — R6889 Other general symptoms and signs: Secondary | ICD-10-CM

## 2017-08-19 DIAGNOSIS — Z79899 Other long term (current) drug therapy: Secondary | ICD-10-CM | POA: Insufficient documentation

## 2017-08-19 LAB — CBC
HCT: 44.3 % (ref 39.0–52.0)
Hemoglobin: 15.7 g/dL (ref 13.0–17.0)
MCH: 31.1 pg (ref 26.0–34.0)
MCHC: 35.4 g/dL (ref 30.0–36.0)
MCV: 87.7 fL (ref 78.0–100.0)
Platelets: 289 10*3/uL (ref 150–400)
RBC: 5.05 MIL/uL (ref 4.22–5.81)
RDW: 13 % (ref 11.5–15.5)
WBC: 6.2 10*3/uL (ref 4.0–10.5)

## 2017-08-19 LAB — COMPREHENSIVE METABOLIC PANEL
ALT: 12 U/L — ABNORMAL LOW (ref 17–63)
AST: 26 U/L (ref 15–41)
Albumin: 4.5 g/dL (ref 3.5–5.0)
Alkaline Phosphatase: 64 U/L (ref 38–126)
Anion gap: 14 (ref 5–15)
BUN: 8 mg/dL (ref 6–20)
CO2: 20 mmol/L — ABNORMAL LOW (ref 22–32)
Calcium: 9.1 mg/dL (ref 8.9–10.3)
Chloride: 101 mmol/L (ref 101–111)
Creatinine, Ser: 0.81 mg/dL (ref 0.61–1.24)
GFR calc Af Amer: >60 mL/min (ref >60)
GFR calc non Af Amer: >60 mL/min (ref >60)
Glucose, Bld: 100 mg/dL — ABNORMAL HIGH (ref 65–99)
Potassium: 3.4 mmol/L — ABNORMAL LOW (ref 3.5–5.1)
Sodium: 135 mmol/L (ref 135–145)
Total Bilirubin: 0.8 mg/dL (ref 0.3–1.2)
Total Protein: 7.7 g/dL (ref 6.5–8.1)

## 2017-08-19 LAB — URINALYSIS, ROUTINE W REFLEX MICROSCOPIC
Bilirubin Urine: NEGATIVE
Glucose, UA: NEGATIVE mg/dL
Hgb urine dipstick: NEGATIVE
Ketones, ur: 20 mg/dL — AB
Leukocytes, UA: NEGATIVE
Nitrite: NEGATIVE
Protein, ur: NEGATIVE mg/dL
Specific Gravity, Urine: 1.018 (ref 1.005–1.030)
pH: 9 — ABNORMAL HIGH (ref 5.0–8.0)

## 2017-08-19 MED ORDER — LORAZEPAM 2 MG/ML IJ SOLN
1.0000 mg | Freq: Once | INTRAMUSCULAR | Status: AC
  Administered 2017-08-19: 1 mg via INTRAVENOUS
  Filled 2017-08-19: qty 1

## 2017-08-19 MED ORDER — ACETAMINOPHEN 325 MG PO TABS
650.0000 mg | ORAL_TABLET | Freq: Once | ORAL | Status: AC
  Administered 2017-08-19: 650 mg via ORAL
  Filled 2017-08-19: qty 2

## 2017-08-19 MED ORDER — METOCLOPRAMIDE HCL 5 MG/ML IJ SOLN
10.0000 mg | Freq: Once | INTRAMUSCULAR | Status: AC
  Administered 2017-08-19: 10 mg via INTRAVENOUS
  Filled 2017-08-19: qty 2

## 2017-08-19 MED ORDER — SODIUM CHLORIDE 0.9 % IV BOLUS (SEPSIS)
1000.0000 mL | Freq: Once | INTRAVENOUS | Status: AC
  Administered 2017-08-19: 1000 mL via INTRAVENOUS

## 2017-08-19 MED ORDER — DIPHENHYDRAMINE HCL 50 MG/ML IJ SOLN
12.5000 mg | Freq: Once | INTRAMUSCULAR | Status: AC
  Administered 2017-08-19: 12.5 mg via INTRAVENOUS
  Filled 2017-08-19: qty 1

## 2017-08-19 MED ORDER — MORPHINE SULFATE (PF) 4 MG/ML IV SOLN
6.0000 mg | Freq: Once | INTRAVENOUS | Status: AC
  Administered 2017-08-19: 6 mg via INTRAVENOUS
  Filled 2017-08-19: qty 2

## 2017-08-19 MED ORDER — KETOROLAC TROMETHAMINE 15 MG/ML IJ SOLN
15.0000 mg | Freq: Once | INTRAMUSCULAR | Status: AC
  Administered 2017-08-19: 15 mg via INTRAVENOUS
  Filled 2017-08-19: qty 1

## 2017-08-19 NOTE — ED Triage Notes (Signed)
Pt presents to ED for assessment of 2 days of flu-like symptoms with congestion and cough.  Pt states he then began having issues with insomnia (hx of same) and then began having generalized body pain.  Patient actively moving in pain in triage.
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
## 2017-12-23 DIAGNOSIS — I7102 Dissection of abdominal aorta: Secondary | ICD-10-CM

## 2017-12-23 HISTORY — DX: Dissection of abdominal aorta: I71.02

## 2017-12-25 ENCOUNTER — Encounter: Payer: Self-pay | Admitting: Emergency Medicine

## 2017-12-25 ENCOUNTER — Emergency Department: Payer: Medicaid Other

## 2017-12-25 ENCOUNTER — Inpatient Hospital Stay
Admission: EM | Admit: 2017-12-25 | Discharge: 2017-12-26 | DRG: 301 | Discharge status: Against Medical Advice | Payer: Medicaid Other | Attending: Internal Medicine | Admitting: Internal Medicine

## 2017-12-25 DIAGNOSIS — R1084 Generalized abdominal pain: Secondary | ICD-10-CM

## 2017-12-25 DIAGNOSIS — I7102 Dissection of abdominal aorta: Principal | ICD-10-CM | POA: Diagnosis present

## 2017-12-25 DIAGNOSIS — F1290 Cannabis use, unspecified, uncomplicated: Secondary | ICD-10-CM | POA: Diagnosis present

## 2017-12-25 DIAGNOSIS — E876 Hypokalemia: Secondary | ICD-10-CM | POA: Diagnosis present

## 2017-12-25 DIAGNOSIS — Z79899 Other long term (current) drug therapy: Secondary | ICD-10-CM | POA: Diagnosis not present

## 2017-12-25 DIAGNOSIS — I700 Atherosclerosis of aorta: Secondary | ICD-10-CM | POA: Diagnosis present

## 2017-12-25 DIAGNOSIS — D72829 Elevated white blood cell count, unspecified: Secondary | ICD-10-CM | POA: Diagnosis present

## 2017-12-25 DIAGNOSIS — G8929 Other chronic pain: Secondary | ICD-10-CM | POA: Diagnosis present

## 2017-12-25 DIAGNOSIS — R1032 Left lower quadrant pain: Secondary | ICD-10-CM | POA: Diagnosis present

## 2017-12-25 DIAGNOSIS — Z8249 Family history of ischemic heart disease and other diseases of the circulatory system: Secondary | ICD-10-CM | POA: Diagnosis not present

## 2017-12-25 DIAGNOSIS — I719 Aortic aneurysm of unspecified site, without rupture: Secondary | ICD-10-CM

## 2017-12-25 DIAGNOSIS — Z7951 Long term (current) use of inhaled steroids: Secondary | ICD-10-CM | POA: Diagnosis not present

## 2017-12-25 DIAGNOSIS — I7100 Dissection of unspecified site of aorta: Secondary | ICD-10-CM

## 2017-12-25 DIAGNOSIS — I10 Essential (primary) hypertension: Secondary | ICD-10-CM | POA: Diagnosis present

## 2017-12-25 HISTORY — DX: Aortic aneurysm of unspecified site, without rupture: I71.9

## 2017-12-25 HISTORY — DX: Essential (primary) hypertension: I10

## 2017-12-25 LAB — CBC WITH DIFFERENTIAL/PLATELET
BASOS ABS: 0.1 10*3/uL (ref 0–0.1)
BASOS PCT: 0 %
EOS ABS: 0.1 10*3/uL (ref 0–0.7)
EOS PCT: 0 %
HCT: 42.8 % (ref 40.0–52.0)
Hemoglobin: 14.7 g/dL (ref 13.0–18.0)
Lymphocytes Relative: 6 %
Lymphs Abs: 0.9 10*3/uL — ABNORMAL LOW (ref 1.0–3.6)
MCH: 31.1 pg (ref 26.0–34.0)
MCHC: 34.3 g/dL (ref 32.0–36.0)
MCV: 90.6 fL (ref 80.0–100.0)
Monocytes Absolute: 0.6 10*3/uL (ref 0.2–1.0)
Monocytes Relative: 4 %
Neutro Abs: 13.9 10*3/uL — ABNORMAL HIGH (ref 1.4–6.5)
Neutrophils Relative %: 90 %
PLATELETS: 414 10*3/uL (ref 150–440)
RBC: 4.72 MIL/uL (ref 4.40–5.90)
RDW: 13.2 % (ref 11.5–14.5)
WBC: 15.4 10*3/uL — AB (ref 3.8–10.6)

## 2017-12-25 LAB — LIPASE, BLOOD: Lipase: 24 U/L (ref 11–51)

## 2017-12-25 LAB — URINALYSIS, COMPLETE (UACMP) WITH MICROSCOPIC
BILIRUBIN URINE: NEGATIVE
Bacteria, UA: NONE SEEN
GLUCOSE, UA: NEGATIVE mg/dL
HGB URINE DIPSTICK: NEGATIVE
KETONES UR: 20 mg/dL — AB
LEUKOCYTES UA: NEGATIVE
Nitrite: NEGATIVE
PH: 8 (ref 5.0–8.0)
Protein, ur: NEGATIVE mg/dL
SQUAMOUS EPITHELIAL / LPF: NONE SEEN (ref 0–5)
Specific Gravity, Urine: 1.025 (ref 1.005–1.030)

## 2017-12-25 LAB — COMPREHENSIVE METABOLIC PANEL
ALT: 12 U/L — AB (ref 17–63)
AST: 21 U/L (ref 15–41)
Albumin: 4.6 g/dL (ref 3.5–5.0)
Alkaline Phosphatase: 69 U/L (ref 38–126)
Anion gap: 12 (ref 5–15)
BUN: 10 mg/dL (ref 6–20)
CO2: 20 mmol/L — ABNORMAL LOW (ref 22–32)
CREATININE: 0.69 mg/dL (ref 0.61–1.24)
Calcium: 9.3 mg/dL (ref 8.9–10.3)
Chloride: 106 mmol/L (ref 101–111)
GFR calc non Af Amer: >60 mL/min (ref >60)
Glucose, Bld: 117 mg/dL — ABNORMAL HIGH (ref 65–99)
Potassium: 3.3 mmol/L — ABNORMAL LOW (ref 3.5–5.1)
SODIUM: 138 mmol/L (ref 135–145)
Total Bilirubin: 1 mg/dL (ref 0.3–1.2)
Total Protein: 7.8 g/dL (ref 6.5–8.1)

## 2017-12-25 LAB — MRSA PCR SCREENING: MRSA BY PCR: NEGATIVE

## 2017-12-25 LAB — GLUCOSE, CAPILLARY: GLUCOSE-CAPILLARY: 129 mg/dL — AB (ref 65–99)

## 2017-12-25 LAB — TROPONIN I

## 2017-12-25 LAB — TYPE AND SCREEN
ABO/RH(D): O POS
Antibody Screen: NEGATIVE

## 2017-12-25 MED ORDER — SODIUM CHLORIDE 0.9% FLUSH: 3.0000 mL | INTRAVENOUS | Status: DC | PRN

## 2017-12-25 MED ORDER — CLOPIDOGREL BISULFATE 75 MG PO TABS
75.0000 mg | ORAL_TABLET | Freq: Every day | ORAL | Status: DC
  Administered 2017-12-25 – 2017-12-26 (×2): 75 mg via ORAL
  Filled 2017-12-25 (×2): qty 1

## 2017-12-25 MED ORDER — HALOPERIDOL LACTATE 5 MG/ML IJ SOLN
INTRAMUSCULAR | Status: AC
  Administered 2017-12-25: 5 mg via INTRAVENOUS
  Filled 2017-12-25: qty 1

## 2017-12-25 MED ORDER — HYDROMORPHONE HCL 1 MG/ML IJ SOLN
1.0000 mg | Freq: Once | INTRAMUSCULAR | Status: AC
  Administered 2017-12-25: 1 mg via INTRAVENOUS
  Filled 2017-12-25: qty 1

## 2017-12-25 MED ORDER — SENNOSIDES-DOCUSATE SODIUM 8.6-50 MG PO TABS: 1.0000 | ORAL_TABLET | Freq: Every evening | ORAL | Status: DC | PRN

## 2017-12-25 MED ORDER — HYDROCODONE-ACETAMINOPHEN 5-325 MG PO TABS
1.0000 | ORAL_TABLET | ORAL | Status: DC | PRN
  Administered 2017-12-25 – 2017-12-26 (×4): 2 via ORAL
  Filled 2017-12-25 (×4): qty 2

## 2017-12-25 MED ORDER — ACETAMINOPHEN 325 MG PO TABS: 650.0000 mg | ORAL_TABLET | Freq: Four times a day (QID) | ORAL | Status: DC | PRN

## 2017-12-25 MED ORDER — ENOXAPARIN SODIUM 40 MG/0.4ML ~~LOC~~ SOLN
40.0000 mg | SUBCUTANEOUS | Status: DC
  Administered 2017-12-25: 40 mg via SUBCUTANEOUS
  Filled 2017-12-25: qty 0.4

## 2017-12-25 MED ORDER — NICARDIPINE HCL IN NACL 20-0.86 MG/200ML-% IV SOLN
3.0000 mg/h | Freq: Once | INTRAVENOUS | Status: AC
  Administered 2017-12-25: 5 mg/h via INTRAVENOUS
  Filled 2017-12-25: qty 200

## 2017-12-25 MED ORDER — SODIUM CHLORIDE 0.9% FLUSH
3.0000 mL | Freq: Two times a day (BID) | INTRAVENOUS | Status: DC
  Administered 2017-12-25 – 2017-12-26 (×2): 3 mL via INTRAVENOUS

## 2017-12-25 MED ORDER — PROTHROMBIN COMPLEX CONC HUMAN 500 UNITS IV KIT: 50.0000 [IU]/kg | PACK | Status: DC

## 2017-12-25 MED ORDER — HALOPERIDOL LACTATE 5 MG/ML IJ SOLN
5.0000 mg | Freq: Once | INTRAMUSCULAR | Status: AC
  Administered 2017-12-25: 5 mg via INTRAVENOUS

## 2017-12-25 MED ORDER — NICARDIPINE HCL IN NACL 20-0.86 MG/200ML-% IV SOLN
3.0000 mg/h | INTRAVENOUS | Status: DC
  Filled 2017-12-25 (×2): qty 200

## 2017-12-25 MED ORDER — NICARDIPINE HCL IN NACL 20-0.86 MG/200ML-% IV SOLN
3.0000 mg/h | INTRAVENOUS | Status: DC
  Administered 2017-12-25 – 2017-12-26 (×3): 3 mg/h via INTRAVENOUS
  Filled 2017-12-25 (×3): qty 200

## 2017-12-25 MED ORDER — MORPHINE SULFATE (PF) 2 MG/ML IV SOLN
2.0000 mg | INTRAVENOUS | Status: DC | PRN
Start: 2017-12-25 — End: 2017-12-26
  Administered 2017-12-25: 2 mg via INTRAVENOUS
  Filled 2017-12-25: qty 1

## 2017-12-25 MED ORDER — POTASSIUM CHLORIDE CRYS ER 20 MEQ PO TBCR
40.0000 meq | EXTENDED_RELEASE_TABLET | Freq: Once | ORAL | Status: AC
  Administered 2017-12-25: 40 meq via ORAL
  Filled 2017-12-25: qty 2

## 2017-12-25 MED ORDER — LABETALOL HCL 5 MG/ML IV SOLN
10.0000 mg | Freq: Once | INTRAVENOUS | Status: DC
  Filled 2017-12-25: qty 4

## 2017-12-25 MED ORDER — SODIUM CHLORIDE 0.9 % IV BOLUS
1000.0000 mL | Freq: Once | INTRAVENOUS | Status: AC
  Administered 2017-12-25: 1000 mL via INTRAVENOUS

## 2017-12-25 MED ORDER — ONDANSETRON HCL 4 MG/2ML IJ SOLN
4.0000 mg | Freq: Once | INTRAMUSCULAR | Status: AC
  Administered 2017-12-25: 4 mg via INTRAVENOUS
  Filled 2017-12-25: qty 2

## 2017-12-25 MED ORDER — ASPIRIN 81 MG PO CHEW
324.0000 mg | CHEWABLE_TABLET | Freq: Once | ORAL | Status: AC
  Administered 2017-12-25: 324 mg via ORAL
  Filled 2017-12-25: qty 4

## 2017-12-25 MED ORDER — IOPAMIDOL (ISOVUE-300) INJECTION 61%: 30.0000 mL | Freq: Once | INTRAVENOUS | Status: DC | PRN

## 2017-12-25 MED ORDER — ACETAMINOPHEN 650 MG RE SUPP: 650.0000 mg | Freq: Four times a day (QID) | RECTAL | Status: DC | PRN

## 2017-12-25 MED ORDER — SODIUM CHLORIDE 0.9 % IV SOLN: 250.0000 mL | INTRAVENOUS | Status: DC | PRN

## 2017-12-25 MED ORDER — ASPIRIN EC 81 MG PO TBEC
81.0000 mg | DELAYED_RELEASE_TABLET | Freq: Every day | ORAL | Status: DC
  Administered 2017-12-26: 81 mg via ORAL
  Filled 2017-12-25: qty 1

## 2017-12-25 MED ORDER — ONDANSETRON HCL 4 MG PO TABS: 4.0000 mg | ORAL_TABLET | Freq: Four times a day (QID) | ORAL | Status: DC | PRN

## 2017-12-25 MED ORDER — IOPAMIDOL (ISOVUE-300) INJECTION 61%
100.0000 mL | Freq: Once | INTRAVENOUS | Status: AC | PRN
  Administered 2017-12-25: 100 mL via INTRAVENOUS

## 2017-12-25 MED ORDER — HYDROMORPHONE HCL 1 MG/ML IJ SOLN
0.5000 mg | Freq: Once | INTRAMUSCULAR | Status: AC
  Administered 2017-12-25: 0.5 mg via INTRAVENOUS
  Filled 2017-12-25: qty 1

## 2017-12-25 MED ORDER — ONDANSETRON HCL 4 MG/2ML IJ SOLN
4.0000 mg | Freq: Four times a day (QID) | INTRAMUSCULAR | Status: DC | PRN
  Administered 2017-12-25: 4 mg via INTRAVENOUS
  Filled 2017-12-25: qty 2

## 2017-12-25 NOTE — ED Notes (Signed)
Floor unable to take report.

## 2017-12-25 NOTE — ED Triage Notes (Signed)
Pt arrived with complaints of LLQ pain that started last night and has progressively worsened in severity. Pt was given 211mcg of fentanyl and 4mg  of zofran in route.
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
## 2017-12-25 NOTE — ED Notes (Addendum)
Floor states they can not take pt right now. Spoke with charge and [facility]

## 2017-12-25 NOTE — ED Notes (Addendum)
Chad Johnson, wife [phone]

## 2017-12-25 NOTE — ED Notes (Signed)
Floor unable to take report at this time.
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
## 2017-12-25 NOTE — ED Notes (Signed)
Spoke with [facility] who states ICU will be ready for report in 15 minutes.
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
## 2017-12-26 ENCOUNTER — Telehealth: Payer: Self-pay

## 2017-12-26 LAB — CBC
HCT: 42.5 % (ref 40.0–52.0)
Hemoglobin: 14.6 g/dL (ref 13.0–18.0)
MCH: 31.1 pg (ref 26.0–34.0)
MCHC: 34.4 g/dL (ref 32.0–36.0)
MCV: 90.2 fL (ref 80.0–100.0)
PLATELETS: 352 10*3/uL (ref 150–440)
RBC: 4.7 MIL/uL (ref 4.40–5.90)
RDW: 13 % (ref 11.5–14.5)
WBC: 11.1 10*3/uL — AB (ref 3.8–10.6)

## 2017-12-26 LAB — BASIC METABOLIC PANEL
Anion gap: 9 (ref 5–15)
BUN: 6 mg/dL (ref 6–20)
CALCIUM: 8.4 mg/dL — AB (ref 8.9–10.3)
CO2: 23 mmol/L (ref 22–32)
CREATININE: 0.73 mg/dL (ref 0.61–1.24)
Chloride: 105 mmol/L (ref 101–111)
Glucose, Bld: 104 mg/dL — ABNORMAL HIGH (ref 65–99)
Potassium: 3.5 mmol/L (ref 3.5–5.1)
SODIUM: 137 mmol/L (ref 135–145)

## 2017-12-26 MED ORDER — METOPROLOL TARTRATE 25 MG PO TABS
25.0000 mg | ORAL_TABLET | Freq: Two times a day (BID) | ORAL | Status: DC
  Administered 2017-12-26: 25 mg via ORAL
  Filled 2017-12-26: qty 1

## 2017-12-26 NOTE — Discharge Summary (Signed)
[facility] NAME: Chad Johnson    MR#:  [id number]  DATE OF BIRTH:  02-Feb-1981  SUBJECTIVE:  CHIEF COMPLAINT: Patient reports left-sided abdominal pain, which is intermittent in nature for several years, would like to talk to Dr. Lucky Cowboy vascular surgery  REVIEW OF SYSTEMS:  CONSTITUTIONAL: No fever, fatigue or weakness.  EYES: No blurred or double vision.  EARS, NOSE, AND THROAT: No tinnitus or ear pain.  RESPIRATORY: No cough, shortness of breath, wheezing or hemoptysis.  CARDIOVASCULAR: No chest pain, orthopnea, edema.  GASTROINTESTINAL: No nausea, vomiting, diarrhea , reporting some abdominal pain which he describes as intermittent episodes of chronic pain GENITOURINARY: No dysuria, hematuria.  ENDOCRINE: No polyuria, nocturia,  HEMATOLOGY: No anemia, easy bruising or bleeding SKIN: No rash or lesion. MUSCULOSKELETAL: No joint pain or arthritis.   NEUROLOGIC: No tingling, numbness, weakness.  PSYCHIATRY: No anxiety or depression.   DRUG ALLERGIES:  No Known Allergies  VITALS:  Blood pressure 133/82, pulse 71, temperature 97.7 F (36.5 C), temperature source Oral, resp. rate 20, height 6\' 2"  (1.88 m), weight 77.1 kg (170 lb), SpO2 97 %.  PHYSICAL EXAMINATION:  GENERAL:  37 y.o.-year-old patient lying in the bed with no acute distress.  EYES: Pupils equal, round, reactive to light and accommodation. No scleral icterus. Extraocular muscles intact.  HEENT: Head atraumatic, normocephalic. Oropharynx and nasopharynx clear.  NECK:  Supple, no jugular venous distention. No thyroid enlargement, no tenderness.  LUNGS: Normal breath sounds bilaterally, no wheezing, rales,rhonchi or crepitation. No use of accessory muscles of respiration.  CARDIOVASCULAR: S1, S2 normal. No murmurs, rubs, or gallops.  ABDOMEN: Soft, minimal tenderness in the periumbilical area but no rebound tenderness nondistended. Bowel sounds  present. EXTREMITIES: No pedal edema, cyanosis, or clubbing.  NEUROLOGIC: Cranial nerves II through XII are intact. Muscle strength 5/5 in all extremities. Sensation intact. Gait not checked.  PSYCHIATRIC: The patient is alert and oriented x 3.  SKIN: No obvious rash, lesion, or ulcer.    LABORATORY PANEL:   CBC Recent Labs  Lab 12/26/17 0337  WBC 11.1*  HGB 14.6  HCT 42.5  PLT 352   ------------------------------------------------------------------------------------------------------------------  Chemistries  Recent Labs  Lab 12/25/17 1345 12/26/17 0337  NA 138 137  K 3.3* 3.5  CL 106 105  CO2 20* 23  GLUCOSE 117* 104*  BUN 10 6  CREATININE 0.69 0.73  CALCIUM 9.3 8.4*  AST 21  --   ALT 12*  --   ALKPHOS 69  --   BILITOT 1.0  --    ------------------------------------------------------------------------------------------------------------------  Cardiac Enzymes Recent Labs  Lab 12/25/17 1345  TROPONINI <0.03   ------------------------------------------------------------------------------------------------------------------  RADIOLOGY:  Ct Abdomen Pelvis W Contrast  Result Date: 12/25/2017 CLINICAL DATA:  Left lower quadrant pain EXAM: CT ABDOMEN AND PELVIS WITH CONTRAST TECHNIQUE: Multidetector CT imaging of the abdomen and pelvis was performed using the standard protocol following bolus administration of intravenous contrast. CONTRAST:  170mL ISOVUE-300 IOPAMIDOL (ISOVUE-300) INJECTION 61%, <See Chart> ISOVUE-300 IOPAMIDOL (ISOVUE-300) INJECTION 61% COMPARISON:  CT 04/18/2015 FINDINGS: Lower chest: Lung bases are clear. No effusions. Heart is normal size. Hepatobiliary: No focal hepatic abnormality. Gallbladder unremarkable. Pancreas: No focal abnormality or ductal dilatation. Spleen: No focal abnormality.  Normal size. Adrenals/Urinary Tract: No adrenal abnormality. Small exophytic lesion off the midpole of the left kidney measures 13 mm compared with 7 mm  previously. No hydronephrosis. Adrenal glands and urinary bladder are unremarkable. Stomach/Bowel: Stomach, large and small bowel grossly  unremarkable. Vascular/Lymphatic: Moderate atherosclerotic noncalcified plaque in the proximal abdominal aorta causes mild luminal narrowing. Alternatively, this conceivably could represent thrombosed focal dissection. This is new since 2015. Mildly prominent left periaortic lymph nodes, none pathologically enlarged. Index node has a short axis diameter of 8 mm and is stable. Reproductive: No visible focal abnormality. Other: No free fluid or free air. Musculoskeletal: No acute bony abnormality. 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 These vessels are widely patent. This is new since contrast study from 2015. Exophytic 13 mm lesion off the midpole of the left kidney does not appear cystic. This could be further evaluated with MRI to exclude enhancing solid renal lesion. Stable small shotty retroperitoneal lymph nodes. Electronically Signed   By: Rolm Baptise M.D.   On: 12/25/2017 14:37    EKG:  No orders found for this or any previous visit.  ASSESSMENT AND PLAN:   37 year old male patient with history of accelerated hypertension presented to the emergency room with abdominal pain, nausea and vomiting .  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 Lucky Cowboy, who is going to see the patient at around 5 PM today but no plans to do any vascular procedures as per my discussion Started patient on aspirin and Plavix orally   -Accelerated hypertension IV nicardipine drip for control of blood pressure weaned off Goal of control is blood pressure on 130 / 80  -Abdominal pain PRN IV morphine for abdominal pain and discomfort  -Acute hypokalemia Replace  potassium orally   -Leukocytosis secondary to stress   Addendum: Discussed with vascular surgery Dr. due who wants to come and see the patient around 5 PM  Patient left hospital [place]     All the records are reviewed and case discussed with Care Management/Social Workerr. Management plans discussed with the patient, family and they are in agreement.  CODE STATUS: FC  TOTAL TIME TAKING CARE OF THIS PATIENT: 33 minutes.      Note: This dictation was prepared with Dragon dictation along with smaller phrase technology. Any transcriptional errors that result from this process are unintentional.   Nicholes Mango M.D on 12/26/2017 at 3:53 PM  Between 7am to 6pm - Pager - [phone] After 6pm go to [email or web address] - password [email or web address] [facility]  Office  [phone]  CC: Primary care physician; Patient, No Pcp Per

## 2017-12-26 NOTE — Telephone Encounter (Signed)
Pt called requesting prescriptions he was told he would receive.  Explained to pt that since he left AMA, he would need to either see his PCP or come back to the ED and be seen by one of our physicians.  Chad Johnson hung-up the phone before the call was completed.

## 2017-12-26 NOTE — Progress Notes (Signed)
Pt has removed cardiac monitor, BP cuff and refuses to have replaced. Again reinforced to pt importance of monitoring heart rate and BP. Pt continues to refuse to have monitor replaced. States that he just wants to go home now. Dr.Gouru aware and states that dr.dew wil be in to see pt after clinic. Pt and wife informed.

## 2017-12-26 NOTE — Progress Notes (Addendum)
Pt again asking to see doctor. Informed again that physician will be in to see him this afternoon after clinic.Pt states that he can not wait any longer and wishes to sign himself out against medical advice. Pt informed of risks of leaving against medical advice. Again reinforced importance of medical care and proper BP and heart rate control. Pt again states that he will not wait any longer. Janett Billow, RN Charge Nurse in to talk with pt and AMA form signed. Pt left against medical advice with wife. Dr.Dew aware.

## 2017-12-26 NOTE — Progress Notes (Signed)
Pt anxious. States that he needs to go home. Requests that physician be notified. Dr.Gouru in to see pt. Dr.Dew notified and states that he will see pt after clinic. Pt and wife informed.

## 2017-12-26 NOTE — Progress Notes (Signed)
Follow up - Critical Care Medicine Note  Patient Details:    Chad Johnson is an 37 y.o. male.with chronic abdominal pain, CT angiogram of the abdomen performed revealed thrombosed focal dissection versus noncalcified plaque. Patient is presently on Cardene infusion for tight blood pressure control.   Lines, Airways, Drains:    Anti-infectives:  Anti-infectives (From admission, onward)   None      Microbiology: Results for orders placed or performed during the hospital encounter of 12/25/17  MRSA PCR Screening     Status: None   Collection Time: 12/25/17  6:04 PM  Result Value Ref Range Status   MRSA by PCR NEGATIVE NEGATIVE Final    Comment:        The GeneXpert MRSA Assay (FDA approved for NASAL specimens only), is one component of a comprehensive MRSA colonization surveillance program. It is not intended to diagnose MRSA infection nor to guide or monitor treatment for MRSA infections. Performed at [facility], [place],  [address]    Events:   Studies: Ct Abdomen Pelvis W Contrast  Result Date: 12/25/2017 CLINICAL DATA:  Left lower quadrant pain EXAM: CT ABDOMEN AND PELVIS WITH CONTRAST TECHNIQUE: Multidetector CT imaging of the abdomen and pelvis was performed using the standard protocol following bolus administration of intravenous contrast. CONTRAST:  162mL ISOVUE-300 IOPAMIDOL (ISOVUE-300) INJECTION 61%, <See Chart> ISOVUE-300 IOPAMIDOL (ISOVUE-300) INJECTION 61% COMPARISON:  CT 04/18/2015 FINDINGS: Lower chest: Lung bases are clear. No effusions. Heart is normal size. Hepatobiliary: No focal hepatic abnormality. Gallbladder unremarkable. Pancreas: No focal abnormality or ductal dilatation. Spleen: No focal abnormality.  Normal size. Adrenals/Urinary Tract: No adrenal abnormality. Small exophytic lesion off the midpole of the left kidney measures 13 mm compared with 7 mm previously. No hydronephrosis. Adrenal glands and urinary  bladder are unremarkable. Stomach/Bowel: Stomach, large and small bowel grossly unremarkable. Vascular/Lymphatic: Moderate atherosclerotic noncalcified plaque in the proximal abdominal aorta causes mild luminal narrowing. Alternatively, this conceivably could represent thrombosed focal dissection. This is new since 2015. Mildly prominent left periaortic lymph nodes, none pathologically enlarged. Index node has a short axis diameter of 8 mm and is stable. Reproductive: No visible focal abnormality. Other: No free fluid or free air. Musculoskeletal: No acute bony abnormality. 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 These vessels are widely patent. This is new since contrast study from 2015. Exophytic 13 mm lesion off the midpole of the left kidney does not appear cystic. This could be further evaluated with MRI to exclude enhancing solid renal lesion. Stable small shotty retroperitoneal lymph nodes. Electronically Signed   By: Rolm Baptise M.D.   On: 12/25/2017 14:37    Consults: Treatment Team:  Pccm, Ander Gaster, MD Algernon Huxley, MD   Subjective:    Overnight Issues: no issues overnight  Objective:  Vital signs for last 24 hours: Temp:  [97.6 F (36.4 C)-98.6 F (37 C)] 97.7 F (36.5 C) (06/04 0900) Pulse Rate:  [65-98] 71 (06/04 0900) Resp:  [6-35] 13 (06/04 0900) BP: (103-186)/(62-107) 115/81 (06/04 0900) SpO2:  [96 %-100 %] 98 % (06/04 0900) Weight:  [170 lb (77.1 kg)] 170 lb (77.1 kg) (06/03 1346)  Hemodynamic parameters for last 24 hours:    Intake/Output from previous day: 06/03 0701 - 06/04 0700 In: 587.4 [I.V.:587.4] Out: 2550 [Urine:2550]  Intake/Output this shift: Total I/O In: 360 [P.O.:360] Out: 400 [  Urine:400]  Vent settings for last 24 hours:    Physical Exam:  Patient is awake, alert in no acute  distress Vital signs stable Cardiovascular: Regular rate and rhythm Pulmonary: Clear to auscultation Abdominal: Soft exam, no significant pain Extremity: Cyanosis or edema noted  Assessment/Plan:   Patient with Abdominal aortic lesion, differential includes eccentric ruptured plaque versus thrombosed dissection. Placed on dual antiplatelet therapy, started on Cardene for tight blood pressure control and rate control. Dr. Lucky Cowboy related this is a chronic lesion, no acute vascular or surgical intervention needed at this time, will start on oral antihypertensives, weaned Cardene infusion and patient may be transferred to the floor  Chad Johnson 12/26/2017  *Care during the described time interval was provided by me and/or other providers on the critical care team.  I have reviewed this patient's available data, including medical history, events of note, physical examination and test results as part of my evaluation.

## 2017-12-27 ENCOUNTER — Inpatient Hospital Stay (HOSPITAL_COMMUNITY)
Admission: EM | Admit: 2017-12-27 | Discharge: 2017-12-31 | DRG: 299 | Disposition: A | Discharge status: Home / Self Care | Payer: Medicaid Other | Attending: Internal Medicine | Admitting: Internal Medicine

## 2017-12-27 ENCOUNTER — Emergency Department (HOSPITAL_COMMUNITY): Payer: Medicaid Other

## 2017-12-27 ENCOUNTER — Other Ambulatory Visit: Payer: Self-pay

## 2017-12-27 ENCOUNTER — Encounter (HOSPITAL_COMMUNITY): Payer: Self-pay | Admitting: Emergency Medicine

## 2017-12-27 DIAGNOSIS — F411 Generalized anxiety disorder: Secondary | ICD-10-CM | POA: Diagnosis not present

## 2017-12-27 DIAGNOSIS — I160 Hypertensive urgency: Secondary | ICD-10-CM | POA: Diagnosis not present

## 2017-12-27 DIAGNOSIS — R1012 Left upper quadrant pain: Secondary | ICD-10-CM | POA: Diagnosis not present

## 2017-12-27 DIAGNOSIS — Z79899 Other long term (current) drug therapy: Secondary | ICD-10-CM | POA: Diagnosis not present

## 2017-12-27 DIAGNOSIS — Z7982 Long term (current) use of aspirin: Secondary | ICD-10-CM

## 2017-12-27 DIAGNOSIS — G92 Toxic encephalopathy: Secondary | ICD-10-CM | POA: Diagnosis present

## 2017-12-27 DIAGNOSIS — Z8249 Family history of ischemic heart disease and other diseases of the circulatory system: Secondary | ICD-10-CM | POA: Diagnosis not present

## 2017-12-27 DIAGNOSIS — I10 Essential (primary) hypertension: Secondary | ICD-10-CM | POA: Diagnosis present

## 2017-12-27 DIAGNOSIS — F1223 Cannabis dependence with withdrawal: Secondary | ICD-10-CM | POA: Diagnosis not present

## 2017-12-27 DIAGNOSIS — Z7951 Long term (current) use of inhaled steroids: Secondary | ICD-10-CM

## 2017-12-27 DIAGNOSIS — I7100 Dissection of unspecified site of aorta: Secondary | ICD-10-CM | POA: Diagnosis present

## 2017-12-27 DIAGNOSIS — I7102 Dissection of abdominal aorta: Secondary | ICD-10-CM

## 2017-12-27 LAB — DIFFERENTIAL
Abs Immature Granulocytes: 0.1 10*3/uL (ref 0.0–0.1)
Basophils Absolute: 0.1 10*3/uL (ref 0.0–0.1)
Basophils Relative: 1 %
EOS PCT: 1 %
Eosinophils Absolute: 0.2 10*3/uL (ref 0.0–0.7)
Immature Granulocytes: 0 %
LYMPHS ABS: 2 10*3/uL (ref 0.7–4.0)
LYMPHS PCT: 13 %
MONO ABS: 1.1 10*3/uL — AB (ref 0.1–1.0)
Monocytes Relative: 7 %
Neutro Abs: 12.2 10*3/uL — ABNORMAL HIGH (ref 1.7–7.7)
Neutrophils Relative %: 78 %

## 2017-12-27 LAB — BASIC METABOLIC PANEL
ANION GAP: 10 (ref 5–15)
BUN: 8 mg/dL (ref 6–20)
CALCIUM: 9.2 mg/dL (ref 8.9–10.3)
CHLORIDE: 102 mmol/L (ref 101–111)
CO2: 26 mmol/L (ref 22–32)
Creatinine, Ser: 0.76 mg/dL (ref 0.61–1.24)
GFR calc Af Amer: >60 mL/min (ref >60)
GFR calc non Af Amer: >60 mL/min (ref >60)
GLUCOSE: 108 mg/dL — AB (ref 65–99)
Potassium: 3.4 mmol/L — ABNORMAL LOW (ref 3.5–5.1)
Sodium: 138 mmol/L (ref 135–145)

## 2017-12-27 LAB — HIV ANTIBODY (ROUTINE TESTING W REFLEX): HIV Screen 4th Generation wRfx: NONREACTIVE

## 2017-12-27 LAB — TYPE AND SCREEN
ABO/RH(D): O POS
ANTIBODY SCREEN: NEGATIVE

## 2017-12-27 LAB — HEPATIC FUNCTION PANEL
ALK PHOS: 69 U/L (ref 38–126)
ALT: 13 U/L — AB (ref 17–63)
AST: 19 U/L (ref 15–41)
Albumin: 4.3 g/dL (ref 3.5–5.0)
BILIRUBIN TOTAL: 0.7 mg/dL (ref 0.3–1.2)
Total Protein: 7.5 g/dL (ref 6.5–8.1)

## 2017-12-27 LAB — CBC
HCT: 42.2 % (ref 39.0–52.0)
Hemoglobin: 14.7 g/dL (ref 13.0–17.0)
MCH: 30.1 pg (ref 26.0–34.0)
MCHC: 34.8 g/dL (ref 30.0–36.0)
MCV: 86.5 fL (ref 78.0–100.0)
Platelets: 438 10*3/uL — ABNORMAL HIGH (ref 150–400)
RBC: 4.88 MIL/uL (ref 4.22–5.81)
RDW: 12.4 % (ref 11.5–15.5)
WBC: 15.4 10*3/uL — ABNORMAL HIGH (ref 4.0–10.5)

## 2017-12-27 LAB — I-STAT CG4 LACTIC ACID, ED
LACTIC ACID, VENOUS: 1.16 mmol/L (ref 0.5–1.9)
Lactic Acid, Venous: 0.64 mmol/L (ref 0.5–1.9)

## 2017-12-27 LAB — ABO/RH: ABO/RH(D): O POS

## 2017-12-27 LAB — HEPARIN LEVEL (UNFRACTIONATED): Heparin Unfractionated: <0.1 IU/mL — ABNORMAL LOW (ref 0.30–0.70)

## 2017-12-27 LAB — I-STAT TROPONIN, ED: TROPONIN I, POC: 0 ng/mL (ref 0.00–0.08)

## 2017-12-27 LAB — LIPASE, BLOOD: Lipase: 21 U/L (ref 11–51)

## 2017-12-27 MED ORDER — PHENOL 1.4 % MT LIQD: 1.0000 | OROMUCOSAL | Status: DC | PRN

## 2017-12-27 MED ORDER — PANTOPRAZOLE SODIUM 40 MG PO TBEC
40.0000 mg | DELAYED_RELEASE_TABLET | Freq: Every day | ORAL | Status: DC
  Administered 2017-12-27 – 2017-12-30 (×4): 40 mg via ORAL
  Filled 2017-12-27 (×4): qty 1

## 2017-12-27 MED ORDER — OXYCODONE-ACETAMINOPHEN 5-325 MG PO TABS
1.0000 | ORAL_TABLET | ORAL | Status: DC | PRN
  Administered 2017-12-27 – 2017-12-31 (×12): 2 via ORAL
  Filled 2017-12-27 (×12): qty 2

## 2017-12-27 MED ORDER — ONDANSETRON HCL 4 MG/2ML IJ SOLN
4.0000 mg | Freq: Four times a day (QID) | INTRAMUSCULAR | Status: DC | PRN
  Administered 2017-12-27 – 2017-12-30 (×6): 4 mg via INTRAVENOUS
  Filled 2017-12-27 (×7): qty 2

## 2017-12-27 MED ORDER — HEPARIN (PORCINE) IN NACL 100-0.45 UNIT/ML-% IJ SOLN
1650.0000 [IU]/h | INTRAMUSCULAR | Status: DC
Start: 2017-12-27 — End: 2017-12-29
  Administered 2017-12-27: 1100 [IU]/h via INTRAVENOUS
  Administered 2017-12-28: 1650 [IU]/h via INTRAVENOUS
  Administered 2017-12-28: 1500 [IU]/h via INTRAVENOUS
  Filled 2017-12-27 (×3): qty 250

## 2017-12-27 MED ORDER — GUAIFENESIN-DM 100-10 MG/5ML PO SYRP: 15.0000 mL | ORAL_SOLUTION | ORAL | Status: DC | PRN

## 2017-12-27 MED ORDER — POTASSIUM CHLORIDE CRYS ER 20 MEQ PO TBCR
20.0000 meq | EXTENDED_RELEASE_TABLET | Freq: Once | ORAL | Status: AC
  Administered 2017-12-27: 20 meq via ORAL
  Filled 2017-12-27: qty 2

## 2017-12-27 MED ORDER — IOPAMIDOL (ISOVUE-370) INJECTION 76%
100.0000 mL | Freq: Once | INTRAVENOUS | Status: AC | PRN
  Administered 2017-12-27: 100 mL via INTRAVENOUS

## 2017-12-27 MED ORDER — IOPAMIDOL (ISOVUE-370) INJECTION 76%
INTRAVENOUS | Status: AC
  Filled 2017-12-27: qty 100

## 2017-12-27 MED ORDER — ESMOLOL HCL-SODIUM CHLORIDE 2000 MG/100ML IV SOLN
25.0000 ug/kg/min | INTRAVENOUS | Status: DC
  Administered 2017-12-27 (×4): 300 ug/kg/min via INTRAVENOUS
  Administered 2017-12-27: 25 ug/kg/min via INTRAVENOUS
  Administered 2017-12-27 (×4): 300 ug/kg/min via INTRAVENOUS
  Administered 2017-12-27: 175 ug/kg/min via INTRAVENOUS
  Administered 2017-12-28: 275 ug/kg/min via INTRAVENOUS
  Administered 2017-12-28 (×2): 300 ug/kg/min via INTRAVENOUS
  Administered 2017-12-28: 275 ug/kg/min via INTRAVENOUS
  Administered 2017-12-28: 200 ug/kg/min via INTRAVENOUS
  Administered 2017-12-28: 300 ug/kg/min via INTRAVENOUS
  Administered 2017-12-28: 275 ug/kg/min via INTRAVENOUS
  Administered 2017-12-28: 300 ug/kg/min via INTRAVENOUS
  Administered 2017-12-28 (×2): 275 ug/kg/min via INTRAVENOUS
  Administered 2017-12-28: 200 ug/kg/min via INTRAVENOUS
  Administered 2017-12-28: 275 ug/kg/min via INTRAVENOUS
  Administered 2017-12-28 (×2): 300 ug/kg/min via INTRAVENOUS
  Administered 2017-12-29: 250 ug/kg/min via INTRAVENOUS
  Administered 2017-12-29: 275 ug/kg/min via INTRAVENOUS
  Administered 2017-12-29: 200 ug/kg/min via INTRAVENOUS
  Administered 2017-12-29: 250 ug/kg/min via INTRAVENOUS
  Administered 2017-12-29: 50 ug/kg/min via INTRAVENOUS
  Administered 2017-12-29: 250 ug/kg/min via INTRAVENOUS
  Administered 2017-12-30: 75 ug/kg/min via INTRAVENOUS
  Filled 2017-12-27 (×35): qty 100

## 2017-12-27 MED ORDER — HYDROMORPHONE HCL 2 MG/ML IJ SOLN: 0.5000 mg | INTRAMUSCULAR | Status: DC | PRN

## 2017-12-27 MED ORDER — NITROGLYCERIN IN D5W 200-5 MCG/ML-% IV SOLN
0.0000 ug/min | INTRAVENOUS | Status: DC
  Administered 2017-12-27: 5 ug/min via INTRAVENOUS
  Filled 2017-12-27: qty 250

## 2017-12-27 MED ORDER — MORPHINE SULFATE (PF) 4 MG/ML IV SOLN
2.0000 mg | INTRAVENOUS | Status: DC | PRN
  Administered 2017-12-27: 2 mg via INTRAVENOUS
  Filled 2017-12-27: qty 1

## 2017-12-27 MED ORDER — ESMOLOL HCL-SODIUM CHLORIDE 2000 MG/100ML IV SOLN: 25.0000 ug/kg/min | INTRAVENOUS | Status: DC

## 2017-12-27 MED ORDER — ONDANSETRON HCL 4 MG/2ML IJ SOLN
4.0000 mg | Freq: Once | INTRAMUSCULAR | Status: AC
  Administered 2017-12-27: 4 mg via INTRAVENOUS
  Filled 2017-12-27: qty 2

## 2017-12-27 MED ORDER — LABETALOL HCL 5 MG/ML IV SOLN
20.0000 mg | Freq: Once | INTRAVENOUS | Status: AC
  Administered 2017-12-27: 20 mg via INTRAVENOUS
  Filled 2017-12-27: qty 4

## 2017-12-27 MED ORDER — HYDROMORPHONE HCL 2 MG/ML IJ SOLN
1.0000 mg | Freq: Once | INTRAMUSCULAR | Status: AC
  Administered 2017-12-27: 1 mg via INTRAVENOUS
  Filled 2017-12-27: qty 1

## 2017-12-27 MED ORDER — HYDRALAZINE HCL 20 MG/ML IJ SOLN: 5.0000 mg | INTRAMUSCULAR | Status: DC | PRN

## 2017-12-27 MED ORDER — ALUM & MAG HYDROXIDE-SIMETH 200-200-20 MG/5ML PO SUSP
15.0000 mL | ORAL | Status: DC | PRN
  Administered 2017-12-27 – 2017-12-29 (×4): 30 mL via ORAL
  Filled 2017-12-27 (×4): qty 30

## 2017-12-27 NOTE — ED Triage Notes (Addendum)
Patient reports worsening upper abdominal /low chest pain onset today with mild SOB , pt. stated he signed out [place] at [place] ICU this afternoon ( admitted-intramural aortic hematoma ), denies emesis or diaphoresis .
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
## 2017-12-27 NOTE — ED Notes (Signed)
Patient transported to CT 

## 2017-12-27 NOTE — Progress Notes (Signed)
Patient NSR 60-70s, has episodes where be become bradycardic as low as 42 but then returns to baseline. Patient asymptomatic. Dr Scot Dock called and made aware. No changes made at this time.
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
## 2017-12-27 NOTE — ED Notes (Signed)
Report given to [facility] RN
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
## 2017-12-27 NOTE — Progress Notes (Signed)
    Subjective  -   Patient admitted earlier today for abdominal pain with CTA revealing aortic dissection beginning around the celiac artery.  He remains with abdominal pain   Physical Exam:  Abdomen tender to touch.  No rebound Extremities well perfused       Assessment/Plan:    Aortic dissection - patient with goal of SBP<120, which has now been achieved with esmolol drip.  Treat abdominal pain as needed  No signs of malperfusion  Chad Johnson 12/27/2017 1:49 PM --  Vitals:   12/27/17 1330 12/27/17 1345  BP: 120/85 124/90  Pulse: 68 68  Resp: 17 12  Temp:    SpO2: 99% 99%    Intake/Output Summary (Last 24 hours) at 12/27/2017 1349 Last data filed at 12/27/2017 1300 Gross per 24 hour  Intake 843.85 ml  Output 700 ml  Net 143.85 ml     Laboratory CBC    Component Value Date/Time   WBC 15.4 (H) 12/27/2017 0123   HGB 14.7 12/27/2017 0123   HCT 42.2 12/27/2017 0123   PLT 438 (H) 12/27/2017 0123    BMET    Component Value Date/Time   NA 138 12/27/2017 0123   K 3.4 (L) 12/27/2017 0123   CL 102 12/27/2017 0123   CO2 26 12/27/2017 0123   GLUCOSE 108 (H) 12/27/2017 0123   BUN 8 12/27/2017 0123   CREATININE 0.76 12/27/2017 0123   CALCIUM 9.2 12/27/2017 0123   GFRNONAA >60 12/27/2017 0123   GFRAA >60 12/27/2017 0123    COAG No results found for: INR, PROTIME No results found for: PTT  Antibiotics Anti-infectives (From admission, onward)   None       V. Leia Alf, M.D. [facility] [place] Office: [phone] Pager:  [phone]

## 2017-12-27 NOTE — Progress Notes (Signed)
Patient c/o inability to swallow foods and liquids. States that he feels like food gets stuck and then it has to come back up. Dr Scot Dock called. New orders placed. BP reading reviewed with MD and Nitro gtt added.

## 2017-12-28 ENCOUNTER — Inpatient Hospital Stay (HOSPITAL_COMMUNITY): Payer: Medicaid Other

## 2017-12-28 DIAGNOSIS — I7102 Dissection of abdominal aorta: Principal | ICD-10-CM

## 2017-12-28 DIAGNOSIS — F411 Generalized anxiety disorder: Secondary | ICD-10-CM

## 2017-12-28 DIAGNOSIS — I361 Nonrheumatic tricuspid (valve) insufficiency: Secondary | ICD-10-CM

## 2017-12-28 DIAGNOSIS — I10 Essential (primary) hypertension: Secondary | ICD-10-CM

## 2017-12-28 LAB — CBC
HEMATOCRIT: 37.7 % — AB (ref 39.0–52.0)
HEMOGLOBIN: 12.9 g/dL — AB (ref 13.0–17.0)
MCH: 30.1 pg (ref 26.0–34.0)
MCHC: 34.2 g/dL (ref 30.0–36.0)
MCV: 87.9 fL (ref 78.0–100.0)
Platelets: 364 10*3/uL (ref 150–400)
RBC: 4.29 MIL/uL (ref 4.22–5.81)
RDW: 12.5 % (ref 11.5–15.5)
WBC: 11.9 10*3/uL — ABNORMAL HIGH (ref 4.0–10.5)

## 2017-12-28 LAB — RAPID URINE DRUG SCREEN, HOSP PERFORMED
AMPHETAMINES: NOT DETECTED
BENZODIAZEPINES: POSITIVE — AB
Barbiturates: NOT DETECTED
Cocaine: NOT DETECTED
OPIATES: POSITIVE — AB
TETRAHYDROCANNABINOL: POSITIVE — AB

## 2017-12-28 LAB — ECHOCARDIOGRAM COMPLETE
Height: 74 in
Weight: 2649.05 oz

## 2017-12-28 LAB — BASIC METABOLIC PANEL
Anion gap: 8 (ref 5–15)
BUN: 8 mg/dL (ref 6–20)
CALCIUM: 8.7 mg/dL — AB (ref 8.9–10.3)
CHLORIDE: 106 mmol/L (ref 101–111)
CO2: 24 mmol/L (ref 22–32)
CREATININE: 0.8 mg/dL (ref 0.61–1.24)
GFR calc non Af Amer: >60 mL/min (ref >60)
GLUCOSE: 104 mg/dL — AB (ref 65–99)
Potassium: 4.1 mmol/L (ref 3.5–5.1)
Sodium: 138 mmol/L (ref 135–145)

## 2017-12-28 LAB — HEPARIN LEVEL (UNFRACTIONATED)
HEPARIN UNFRACTIONATED: 0.21 [IU]/mL — AB (ref 0.30–0.70)
HEPARIN UNFRACTIONATED: 0.29 [IU]/mL — AB (ref 0.30–0.70)
Heparin Unfractionated: 0.33 IU/mL (ref 0.30–0.70)

## 2017-12-28 MED ORDER — ALPRAZOLAM 0.5 MG PO TABS
1.0000 mg | ORAL_TABLET | ORAL | Status: DC | PRN
  Administered 2017-12-28 – 2017-12-29 (×3): 1 mg via ORAL
  Filled 2017-12-28 (×3): qty 2

## 2017-12-28 MED ORDER — HYDRALAZINE HCL 25 MG PO TABS
25.0000 mg | ORAL_TABLET | Freq: Once | ORAL | Status: AC
  Administered 2017-12-28: 25 mg via ORAL
  Filled 2017-12-28: qty 1

## 2017-12-28 MED ORDER — HYDRALAZINE HCL 50 MG PO TABS
50.0000 mg | ORAL_TABLET | Freq: Four times a day (QID) | ORAL | Status: DC
  Administered 2017-12-28 – 2017-12-31 (×9): 50 mg via ORAL
  Filled 2017-12-28 (×12): qty 1

## 2017-12-28 MED ORDER — HALOPERIDOL LACTATE 5 MG/ML IJ SOLN
INTRAMUSCULAR | Status: AC
  Administered 2017-12-29: 5 mg via INTRAVENOUS
  Filled 2017-12-28: qty 2

## 2017-12-28 MED ORDER — METOPROLOL TARTRATE 50 MG PO TABS
50.0000 mg | ORAL_TABLET | Freq: Two times a day (BID) | ORAL | Status: DC
  Administered 2017-12-28 – 2017-12-30 (×5): 50 mg via ORAL
  Filled 2017-12-28 (×5): qty 1

## 2017-12-28 MED ORDER — ALPRAZOLAM 0.5 MG PO TABS
1.0000 mg | ORAL_TABLET | Freq: Four times a day (QID) | ORAL | Status: DC | PRN
  Administered 2017-12-28 (×2): 1 mg via ORAL
  Filled 2017-12-28 (×2): qty 2

## 2017-12-28 MED ORDER — HALOPERIDOL LACTATE 5 MG/ML IJ SOLN
10.0000 mg | Freq: Once | INTRAMUSCULAR | Status: AC
  Administered 2017-12-28: 10 mg via INTRAVENOUS

## 2017-12-28 MED ORDER — HYDRALAZINE HCL 20 MG/ML IJ SOLN
5.0000 mg | Freq: Four times a day (QID) | INTRAMUSCULAR | Status: DC | PRN
  Administered 2017-12-28 – 2017-12-30 (×3): 5 mg via INTRAVENOUS
  Filled 2017-12-28 (×3): qty 1

## 2017-12-28 MED ORDER — HYDRALAZINE HCL 25 MG PO TABS
25.0000 mg | ORAL_TABLET | Freq: Four times a day (QID) | ORAL | Status: DC
  Administered 2017-12-28: 25 mg via ORAL
  Filled 2017-12-28: qty 1

## 2017-12-28 MED ORDER — LORAZEPAM 2 MG/ML IJ SOLN
1.0000 mg | Freq: Once | INTRAMUSCULAR | Status: AC
  Administered 2017-12-28: 1 mg via INTRAVENOUS
  Filled 2017-12-28: qty 1

## 2017-12-28 MED ORDER — HYDROMORPHONE HCL 1 MG/ML IJ SOLN
1.0000 mg | INTRAMUSCULAR | Status: DC | PRN
  Administered 2017-12-28 – 2017-12-31 (×7): 1 mg via INTRAVENOUS
  Filled 2017-12-28 (×7): qty 1

## 2017-12-28 MED ORDER — HYDRALAZINE HCL 50 MG PO TABS: 50.0000 mg | ORAL_TABLET | Freq: Four times a day (QID) | ORAL | Status: DC

## 2017-12-28 MED ORDER — DEXMEDETOMIDINE HCL IN NACL 400 MCG/100ML IV SOLN
0.2000 ug/kg/h | INTRAVENOUS | Status: DC
  Administered 2017-12-28 – 2017-12-29 (×2): 0.4 ug/kg/h via INTRAVENOUS
  Administered 2017-12-29: 0.7 ug/kg/h via INTRAVENOUS
  Administered 2017-12-30: 0.2 ug/kg/h via INTRAVENOUS
  Administered 2017-12-30: 0.7 ug/kg/h via INTRAVENOUS
  Filled 2017-12-28 (×5): qty 100

## 2017-12-28 NOTE — Progress Notes (Signed)
Spoke with MD Eveland for clarification of urine drug screen. Spoke with lab for updated order.  Shella Spearing, RN

## 2017-12-28 NOTE — Progress Notes (Signed)
Patient has been increasingly getting more anxious since 0430 AM. MD on call paged 3 times. RN has not received call back.   Patient now complaining of extreme pain but refuses to take any Morphine because it makes him feel "loopy."   Patient's wife spoke with RN and mentioned that this same thing happened in the last facility her husband was at. Patient ended up leaving AMA because he felt too anxious.    RN waiting to hear back from MD. Patient and patient's wife made aware that MD had been notified.   Will continue to monitor.   Chad Johnson E Reola Mosher, [place]
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
## 2017-12-28 NOTE — Progress Notes (Signed)
[facility] Progress Note Patient Name: Chad Johnson DOB: 1980-10-04 MRN: [id number]   Date of Service  12/28/2017  HPI/Events of Note  Pt with aortic dissection who is intermittently agitated and combative.  eICU Interventions  Order entered for Precedex infusion for sedation, anxiolysis and augmentation of analgesia.         Kerry Kass Judeth Gilles 12/28/2017, 8:26 PM

## 2017-12-28 NOTE — Progress Notes (Signed)
20:15Bridgett Larsson, MD at bedside. Discussion with patient's wife to consult CCM to start patient on Precedex due to patient being agitated and combative.   20:30Lucile Shutters, MD called this RN. Verbal orders to start Precedex at 0.54mcg.

## 2017-12-28 NOTE — Progress Notes (Signed)
2 D echo results show Left ventricle: The cavity size was normal. Systolic function was   normal. The estimated ejection fraction was in the range of 60%   to 65%. Wall motion was normal; there were no regional wall   motion abnormalities. Left ventricular diastolic function   parameters were normal. BP meds adjusted, increased for improvement of his elevated BP, as initial doses are not appearing to reach therapeutic level: Apresoline 50 mg q 6 h Will also give 1 dose of  Ativan for anxiety

## 2017-12-28 NOTE — Progress Notes (Addendum)
Not written in wrong chart so deleted.   Deitra Mayo, MD, [place] [phone] Office: [phone]  12/28/2017

## 2017-12-28 NOTE — Plan of Care (Signed)
Patient stood up out of bed without calling for assistance, instructed pt to call for help getting up. High fall risk initiated   with  sign, floor mats and ID band. Explained to pt the risks of falling and why these safety measures were initiated. Call bell within reach. Wife at bedside.
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
## 2017-12-28 NOTE — Progress Notes (Signed)
Patient Precedex and Esmolol gtt increased patients HR dropped to 40s.  Chen MD paged. Verbal order for HR to be 50s if patient not symptomatic but ideally HR in 60s.  

## 2017-12-28 NOTE — Plan of Care (Signed)
Dr. Scot Dock notified of pt requesting to see him

## 2017-12-28 NOTE — Progress Notes (Signed)
   VASCULAR SURGERY ASSESSMENT & PLAN:   Abominal pain is resolved, but still on max. Esmolol.   Appreciate Medicine's help with his HTN.  Transfer out of unit once off Esmolol.  SUBJECTIVE:   No abdominal pain  PHYSICAL EXAM:   Vitals:   12/28/17 1545 12/28/17 1600 12/28/17 1613 12/28/17 1615  BP: 112/78 (!) 155/86  128/79  Pulse: 64 72  69  Resp: 19 14  18   Temp:      TempSrc:   Oral   SpO2: 100% 98%  100%  Weight:      Height:       Abdomen non-tender.   LABS:   Lab Results  Component Value Date   WBC 11.9 (H) 12/28/2017   HGB 12.9 (L) 12/28/2017   HCT 37.7 (L) 12/28/2017   MCV 87.9 12/28/2017   PLT 364 12/28/2017   Lab Results  Component Value Date   CREATININE 0.80 12/28/2017   CBG (last 3)  Recent Labs    12/25/17 1753  GLUCAP 129*   PROBLEM LIST:    Active Problems:   GAD (generalized anxiety disorder)   Accelerated hypertension   Aortic dissection (HCC)   CURRENT MEDS:   . hydrALAZINE  50 mg Oral Q6H  . metoprolol tartrate  50 mg Oral BID  . pantoprazole  40 mg Oral Daily    Deitra Mayo Beeper: [phone] Office: [phone] 12/28/2017

## 2017-12-28 NOTE — Progress Notes (Signed)
Patient reporting Anxiety and discussing leaving AMA r/t his reports of lack of understanding of his plan of care and need to be present to [place] his line of work.  THis was different from my interaction with patinet yesterday.  Reviewed all of the interventions for his blood pressure and anxiety that have been added with patient and offered to provide solutions related to his reports of bed too uncomfortable and needing something to eat.  He refuses these interventions at this time.  He reports that he would like to communicate with MD about his plan of care.  I learned from Primary RN Patty that Dr. Scot Dock plans to see patient after surgery and shared this with patient setting context that I could not provide an exact time as Dr. Scot Dock was currently in surgery.  Patient reports he did speak with Dr. Allyson Sabal this AM but thought he would be going home today and does not acknowledge the reason his blood pressure is under control is related to the continuous IV medication he is receiving.   I did reach out to the [facility] NP Sharene Butters to confirm she was aware that patient was still receiving max dose esmolol at this time even though all PO medication interventions and PRN IVP interventions they ordered have been administered.  Clarise Cruz shared thought that anxiety/agitation could be causative factor but she would review medications to determine if additional antihypertensive intervention was needed at this time.     Richardean Canal RN, BSN, CCRN
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
## 2017-12-28 NOTE — Plan of Care (Signed)
Patient extremely agitated, cursing at nurse. Explained to pt that we needed to keep BP cuff on as we are titrating a cardiac med for blood pressure control. Also made pt aware of need to call for assistance going to bathroom, that we needed to be safe. Explained to patient that we are trying to convert blood pressure med to by mouth. Patient noncomplaint with treatment. Leadership asked to talk to patient.
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
## 2017-12-28 NOTE — Progress Notes (Signed)
Pt experiencing increased anxiety and agitation. Called in room by wife for help. Pt refusing to get back in bed. Pt difficult to de-escalate. Spoke with Dr. Bridgett Larsson via phone, order for additional medication (Haldol) to support de-escalation. Security at bedside. Pt de-escalated, agreed to get back in bed. Given additional medications.  Shella Spearing, RN

## 2017-12-28 NOTE — Progress Notes (Signed)
  Echocardiogram 2D Echocardiogram has been performed.  Bobbye Charleston 12/28/2017, 9:42 AM
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
## 2017-12-28 NOTE — Plan of Care (Signed)
  Problem: Education: Goal: Knowledge of General Education information will improve Outcome: Progressing   Problem: Health Behavior/Discharge Planning: Goal: Ability to manage health-related needs will improve Outcome: Progressing   Problem: Clinical Measurements: Goal: Ability to maintain clinical measurements within normal limits will improve Outcome: Progressing Goal: Respiratory complications will improve Outcome: Progressing

## 2017-12-29 ENCOUNTER — Other Ambulatory Visit: Payer: Self-pay

## 2017-12-29 DIAGNOSIS — I7101 Dissection of thoracic aorta: Secondary | ICD-10-CM

## 2017-12-29 DIAGNOSIS — I71019 Dissection of thoracic aorta, unspecified: Secondary | ICD-10-CM

## 2017-12-29 DIAGNOSIS — I160 Hypertensive urgency: Secondary | ICD-10-CM

## 2017-12-29 LAB — CBC
HCT: 38.9 % — ABNORMAL LOW (ref 39.0–52.0)
HEMOGLOBIN: 13.3 g/dL (ref 13.0–17.0)
MCH: 30.8 pg (ref 26.0–34.0)
MCHC: 34.2 g/dL (ref 30.0–36.0)
MCV: 90 fL (ref 78.0–100.0)
PLATELETS: 351 10*3/uL (ref 150–400)
RBC: 4.32 MIL/uL (ref 4.22–5.81)
RDW: 12.9 % (ref 11.5–15.5)
WBC: 10.6 10*3/uL — AB (ref 4.0–10.5)

## 2017-12-29 LAB — HEPARIN LEVEL (UNFRACTIONATED): Heparin Unfractionated: 0.63 IU/mL (ref 0.30–0.70)

## 2017-12-29 MED ORDER — CALCIUM CARBONATE ANTACID 500 MG PO CHEW
2.0000 | CHEWABLE_TABLET | Freq: Every day | ORAL | Status: DC | PRN
  Administered 2017-12-29: 400 mg via ORAL
  Filled 2017-12-29 (×2): qty 2

## 2017-12-29 MED ORDER — ENOXAPARIN SODIUM 40 MG/0.4ML ~~LOC~~ SOLN
40.0000 mg | SUBCUTANEOUS | Status: DC
  Administered 2017-12-29 – 2017-12-31 (×3): 40 mg via SUBCUTANEOUS
  Filled 2017-12-29 (×3): qty 0.4

## 2017-12-29 MED ORDER — FAMOTIDINE 20 MG PO TABS: 20.0000 mg | ORAL_TABLET | Freq: Every day | ORAL | Status: DC

## 2017-12-29 MED ORDER — ALPRAZOLAM 0.5 MG PO TABS
1.0000 mg | ORAL_TABLET | ORAL | Status: DC | PRN
  Administered 2017-12-29 (×2): 2 mg via ORAL
  Filled 2017-12-29 (×3): qty 4

## 2017-12-29 MED ORDER — HALOPERIDOL LACTATE 5 MG/ML IJ SOLN
2.0000 mg | Freq: Four times a day (QID) | INTRAMUSCULAR | Status: DC | PRN
  Administered 2017-12-29 (×2): 5 mg via INTRAVENOUS
  Filled 2017-12-29 (×2): qty 1

## 2017-12-29 NOTE — Consult Note (Signed)
[place] TEAM 1 - Stepdown/ICU TEAM  Chad Johnson  YCX:[id number] DOB: [birth year] DOA: 12/27/2017 PCP: Patient, No Pcp Per    Brief Narrative:  37yo M w/ HTN and anxiety who presented to the hospital w/ acute onset abdom pain and was 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 The Vascular Surgery Service requested medical consultation to establish an oral antihypertensive regimen, w/ a goal systolic blood pressure of < 120.  Significant Events: 6/5 admit 6/6 TRH consulted  6/6 TTE   Subjective: Pt is sedated.  There is no evidence of resp distress or uncontrolled pain.    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 rate 16, height 6\' 2"  (1.88 m), weight 75.1 kg (165 lb 9.1 oz), SpO2 100 %.  Intake/Output Summary (Last 24 hours) at 12/29/2017 1119 Last data filed at 12/29/2017 1100 Gross per 24 hour  Intake 2427.27 ml  Output 2200 ml  Net 227.27 ml   Filed Weights   12/27/17 0117 12/27/17 0815  Weight: 77.1 kg (170 lb) 75.1 kg (165 lb 9.1 oz)    Examination: General: No acute respiratory distress Lungs: Clear to auscultation bilaterally without wheezes or crackles Cardiovascular: Regular rate and rhythm without murmur  Abdomen: Nondistended, soft, bowel sounds positive Extremities: No signif edema bilateral lower extremities  CBC: Recent Labs  Lab  12/25/17 1345  12/27/17 0123 12/28/17 0102 12/29/17 0259  WBC 15.4*   < > 15.4* 11.9* 10.6*  NEUTROABS 13.9*  --  12.2*  --   --   HGB 14.7   < > 14.7 12.9* 13.3  HCT 42.8   < > 42.2 37.7* 38.9*  MCV 90.6   < > 86.5 87.9 90.0  PLT 414   < > 438* 364 351   < > = values in this interval not displayed.   Basic Metabolic Panel: Recent Labs  Lab 12/26/17 0337 12/27/17 0123 12/28/17 0102  NA 137 138 138  K 3.5 3.4* 4.1  CL 105 102 106  CO2 23 26 24   GLUCOSE 104* 108* 104*  BUN 6 8 8   CREATININE 0.73 0.76 0.80  CALCIUM 8.4* 9.2 8.7*   GFR: Estimated Creatinine Clearance: 134.3 mL/min (by C-G formula based on SCr of 0.8 mg/dL).  Liver Function Tests: Recent Labs  Lab 12/25/17 1345 12/27/17 0123  AST 21 19  ALT 12* 13*  ALKPHOS 69 69  BILITOT 1.0 0.7  PROT 7.8 7.5  ALBUMIN 4.6 4.3   Recent Labs  Lab 12/25/17 1345 12/27/17 0123  LIPASE 24 21    Cardiac Enzymes: Recent Labs  Lab 12/25/17 1345  TROPONINI <0.03    CBG: Recent Labs  Lab 12/25/17 1753  GLUCAP 129*    Recent Results (from the past 240 hour(s))  MRSA PCR Screening     Status: None  Collection Time: 12/25/17  6:04 PM  Result Value Ref Range Status   MRSA by PCR NEGATIVE NEGATIVE Final    Comment:        The GeneXpert MRSA Assay (FDA approved for NASAL specimens only), is one component of a comprehensive MRSA colonization surveillance program. It is not intended to diagnose MRSA infection nor to guide or monitor treatment for MRSA infections. Performed at [facility], [address] [place], [place] [address]      Scheduled Meds: . enoxaparin (LOVENOX) injection  40 mg Subcutaneous Q24H  . hydrALAZINE  50 mg Oral Q6H  . metoprolol tartrate  50 mg Oral BID  . pantoprazole  40 mg Oral Daily   Continuous Infusions: . dexmedetomidine (PRECEDEX) IV infusion 0.399 mcg/kg/hr (12/29/17 1100)  . esmolol 74.795 mcg/kg/min (12/29/17 1100)  . nitroGLYCERIN Stopped (12/28/17  0311)     LOS: 2 days   Cherene Altes, MD [facility] Office  [phone] Pager - Text Page per Amion as per below:  On-Call/Text Page:      [email or web address]      password [email or web address]  If 7PM-7AM, please contact night-coverage [email or web address] Password [email or web address] 12/29/2017, 11:19 AM
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
## 2017-12-29 NOTE — Progress Notes (Signed)
Patient is calm and cooperative. Resting at intervals and during those times heart rate falls to in the 40S. While awake heart rate in the 60s. Tolerated medications and food intake well. PRN medications given to help anxiety and working well. Will continue to monitor.

## 2017-12-29 NOTE — Progress Notes (Signed)
Patient woke up for this RN to take his temp. Patient stated, "I am just ready to lay in my own bed." This RN explained that it is not safe for him to go home yet. Patient voiced that he understood. Patient asked for his wife and for this RN to call her. This RN called Loma Sousa (wife) to update. Wife to come to bedside tonight. Will continue to monitor patient.

## 2017-12-29 NOTE — Progress Notes (Signed)
   VASCULAR SURGERY ASSESSMENT & PLAN:   HYPERTENSION: Mental status improved and blood pressure under better control on Precedex.  I have consulted the medical service for management of his blood pressure as an outpatient.  Once his blood pressure can is controlled on po meds and he is off the Precedex he will be ready for discharge.    No further abdominal pain.  He will need a follow-up CT of the chest abdomen and pelvis in 3 months.  SUBJECTIVE:   No abdominal pain or back pain.  Not combative this morning.  PHYSICAL EXAM:   Vitals:   12/29/17 0600 12/29/17 0615 12/29/17 0630 12/29/17 0645  BP: (!) 124/91 102/69 104/66 113/70  Pulse: 64 (!) 54 67 64  Resp: 18 13 12 14   Temp:      TempSrc:   Oral   SpO2: 99% 99% 97% 99%  Weight:      Height:       Abdomen is soft and nontender.  LABS:   Lab Results  Component Value Date   WBC 10.6 (H) 12/29/2017   HGB 13.3 12/29/2017   HCT 38.9 (L) 12/29/2017   MCV 90.0 12/29/2017   PLT 351 12/29/2017   Lab Results  Component Value Date   CREATININE 0.80 12/28/2017   No results found for: INR, PROTIME CBG (last 3)  No results for input(s): GLUCAP in the last 72 hours.  PROBLEM LIST:    Active Problems:   GAD (generalized anxiety disorder)   Accelerated hypertension   Aortic dissection (HCC)   CURRENT MEDS:   . hydrALAZINE  50 mg Oral Q6H  . metoprolol tartrate  50 mg Oral BID  . pantoprazole  40 mg Oral Daily    Deitra Mayo Beeper: [phone] Office: [phone] 12/29/2017

## 2017-12-30 LAB — CBC
HCT: 37.6 % — ABNORMAL LOW (ref 39.0–52.0)
HEMOGLOBIN: 13 g/dL (ref 13.0–17.0)
MCH: 30.3 pg (ref 26.0–34.0)
MCHC: 34.6 g/dL (ref 30.0–36.0)
MCV: 87.6 fL (ref 78.0–100.0)
PLATELETS: 415 10*3/uL — AB (ref 150–400)
RBC: 4.29 MIL/uL (ref 4.22–5.81)
RDW: 12.4 % (ref 11.5–15.5)
WBC: 10.6 10*3/uL — ABNORMAL HIGH (ref 4.0–10.5)

## 2017-12-30 MED ORDER — CLONIDINE HCL 0.1 MG/24HR TD PTWK
0.1000 mg | MEDICATED_PATCH | TRANSDERMAL | Status: DC
  Administered 2017-12-30: 0.1 mg via TRANSDERMAL
  Filled 2017-12-30: qty 1

## 2017-12-30 MED ORDER — CLONIDINE HCL 0.1 MG PO TABS
0.1000 mg | ORAL_TABLET | Freq: Three times a day (TID) | ORAL | Status: DC
  Administered 2017-12-30: 0.1 mg via ORAL
  Filled 2017-12-30: qty 1

## 2017-12-30 MED ORDER — HYDROXYZINE HCL 10 MG PO TABS
10.0000 mg | ORAL_TABLET | ORAL | Status: DC | PRN
  Administered 2017-12-30: 25 mg via ORAL
  Filled 2017-12-30 (×2): qty 3

## 2017-12-30 MED ORDER — FLUTICASONE PROPIONATE 50 MCG/ACT NA SUSP
2.0000 | Freq: Every day | NASAL | Status: DC
  Administered 2017-12-30 – 2017-12-31 (×2): 2 via NASAL
  Filled 2017-12-30: qty 16

## 2017-12-30 MED ORDER — LORAZEPAM 2 MG/ML IJ SOLN
0.5000 mg | Freq: Four times a day (QID) | INTRAMUSCULAR | Status: DC
  Administered 2017-12-30 – 2017-12-31 (×4): 0.5 mg via INTRAVENOUS
  Filled 2017-12-30 (×4): qty 1

## 2017-12-30 MED ORDER — PROMETHAZINE HCL 25 MG/ML IJ SOLN
12.5000 mg | Freq: Four times a day (QID) | INTRAMUSCULAR | Status: DC | PRN
  Administered 2017-12-30 – 2017-12-31 (×3): 25 mg via INTRAVENOUS
  Filled 2017-12-30 (×3): qty 1

## 2017-12-30 MED ORDER — LORAZEPAM 2 MG/ML IJ SOLN
1.0000 mg | INTRAMUSCULAR | Status: DC | PRN
  Filled 2017-12-30 (×2): qty 1

## 2017-12-30 MED ORDER — QUETIAPINE FUMARATE 50 MG PO TABS
50.0000 mg | ORAL_TABLET | Freq: Every day | ORAL | Status: DC
  Administered 2017-12-30: 50 mg via ORAL
  Filled 2017-12-30: qty 1

## 2017-12-30 MED ORDER — LORATADINE 10 MG PO TABS
10.0000 mg | ORAL_TABLET | Freq: Every day | ORAL | Status: DC
  Administered 2017-12-30 – 2017-12-31 (×2): 10 mg via ORAL
  Filled 2017-12-30 (×2): qty 1

## 2017-12-30 MED ORDER — CLONIDINE HCL 0.2 MG PO TABS
0.2000 mg | ORAL_TABLET | Freq: Three times a day (TID) | ORAL | Status: DC
  Administered 2017-12-30 – 2017-12-31 (×2): 0.2 mg via ORAL
  Filled 2017-12-30 (×2): qty 1

## 2017-12-30 MED ORDER — HYDROXYZINE HCL 10 MG PO TABS: 10.0000 mg | ORAL_TABLET | ORAL | Status: DC | PRN

## 2017-12-30 MED ORDER — QUETIAPINE FUMARATE 25 MG PO TABS
25.0000 mg | ORAL_TABLET | Freq: Two times a day (BID) | ORAL | Status: DC
  Administered 2017-12-30: 25 mg via ORAL
  Filled 2017-12-30: qty 1

## 2017-12-30 MED ORDER — LORAZEPAM 2 MG/ML IJ SOLN
1.0000 mg | INTRAMUSCULAR | Status: DC | PRN
  Administered 2017-12-30: 1 mg via INTRAVENOUS
  Filled 2017-12-30: qty 1

## 2017-12-30 MED ORDER — QUETIAPINE FUMARATE 25 MG PO TABS
25.0000 mg | ORAL_TABLET | Freq: Every day | ORAL | Status: DC
  Administered 2017-12-31: 25 mg via ORAL
  Filled 2017-12-30: qty 1

## 2017-12-30 MED ORDER — METOPROLOL TARTRATE 50 MG PO TABS
100.0000 mg | ORAL_TABLET | Freq: Two times a day (BID) | ORAL | Status: DC
  Administered 2017-12-30 – 2017-12-31 (×2): 100 mg via ORAL
  Filled 2017-12-30 (×2): qty 2

## 2017-12-30 NOTE — Consult Note (Signed)
[place] TEAM 1 - Stepdown/ICU TEAM  Chad Johnson  [id number] DOB: 1981/04/30 DOA: 12/27/2017 PCP: Patient, No Pcp Per    Brief Narrative:  37yo M w/ HTN and anxiety who presented to the hospital w/ acute onset abdom pain and was 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 The Vascular Surgery Service requested medical consultation to establish an oral antihypertensive regimen, w/ a goal systolic blood pressure of < 120.  Significant Events: 6/5 admit 6/6 TRH consulted  6/6 TTE   Subjective: Chad Johnson is now again sedated but he has been suffering with severe uncontrolled anxiety, nausea, and vomiting.  His nurse was able to obtain a history of essentially all day continuous use of marijuana as self-medication for severe anxiety.  In that all tx is presently medical in nature, TRH will assume the attending role for this patient, unless VVS objects.    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 rate 14, height 6\' 2"  (1.88 m), weight 75.1 kg (165 lb 9.1 oz), SpO2 98 %.  Intake/Output Summary (Last 24 hours)  at 12/30/2017 1010 Last data filed at 12/30/2017 0800 Gross per 24 hour  Intake 745.35 ml  Output 1600 ml  Net -854.65 ml   Filed Weights   12/27/17 0117 12/27/17 0815  Weight: 77.1 kg (170 lb) 75.1 kg (165 lb 9.1 oz)    Examination: General: No acute respiratory distress - sedate at time of visit  Lungs: CTA B - no wheezing or crackles  Cardiovascular: RRR - no M or rub  Abdomen: Nondistended, soft, bowel sounds positive Extremities: No signif edema B LE  CBC: Recent Labs  Lab 12/25/17 1345  12/27/17 0123 12/28/17 0102 12/29/17 0259 12/30/17 0252  WBC 15.4*   < > 15.4* 11.9* 10.6* 10.6*  NEUTROABS 13.9*  --  12.2*  --   --   --   HGB 14.7   < > 14.7 12.9* 13.3 13.0  HCT 42.8   < > 42.2 37.7* 38.9* 37.6*  MCV 90.6   < > 86.5 87.9 90.0 87.6  PLT 414   < > 438* 364 351 415*   < > = values in this interval not displayed.   Basic Metabolic Panel: Recent Labs  Lab 12/26/17 0337 12/27/17 0123 12/28/17 0102  NA 137 138 138  K 3.5 3.4* 4.1  CL 105 102 106  CO2 23 26 24   GLUCOSE 104* 108* 104*  BUN 6 8 8   CREATININE 0.73 0.76 0.80  CALCIUM 8.4* 9.2 8.7*   GFR: Estimated Creatinine Clearance:  134.3 mL/min (by C-G formula based on SCr of 0.8 mg/dL).  Liver Function Tests: Recent Labs  Lab 12/25/17 1345 12/27/17 0123  AST 21 19  ALT 12* 13*  ALKPHOS 69 69  BILITOT 1.0 0.7  PROT 7.8 7.5  ALBUMIN 4.6 4.3   Recent Labs  Lab 12/25/17 1345 12/27/17 0123  LIPASE 24 21    Cardiac Enzymes: Recent Labs  Lab 12/25/17 1345  TROPONINI <0.03    CBG: Recent Labs  Lab 12/25/17 1753  GLUCAP 129*    Recent Results (from the past 240 hour(s))  MRSA PCR Screening     Status: None   Collection Time: 12/25/17  6:04 PM  Result Value Ref Range Status   MRSA by PCR NEGATIVE NEGATIVE Final    Comment:        The GeneXpert MRSA Assay (FDA approved for NASAL specimens only), is one component of a comprehensive MRSA colonization surveillance program. It is  not intended to diagnose MRSA infection nor to guide or monitor treatment for MRSA infections. Performed at [facility], [address] [place], [place] [address]      Scheduled Meds: . enoxaparin (LOVENOX) injection  40 mg Subcutaneous Q24H  . hydrALAZINE  50 mg Oral Q6H  . metoprolol tartrate  50 mg Oral BID  . pantoprazole  40 mg Oral Daily   Continuous Infusions: . dexmedetomidine (PRECEDEX) IV infusion 0.7 mcg/kg/hr (12/30/17 0600)  . esmolol 75 mcg/kg/min (12/30/17 0846)     LOS: 3 days   Cherene Altes, MD [facility] Office  [phone] Pager - Text Page per Amion as per below:  On-Call/Text Page:      [email or web address]      password [email or web address]  If 7PM-7AM, please contact night-coverage [email or web address] Password [email or web address] 12/30/2017, 10:10 AM

## 2017-12-30 NOTE — Progress Notes (Signed)
Extensive discussion with wife concerning pt psych history. 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 Now he uses marijuana, and "smokes a bowl every 30 minutes" t/o the day in order to control his anxiety. She reports he is otherwise highly anxious, and is unable to function. He has never been on any anti-psychotic medication in the past, and does not appear to cycle between highs/lows in mood, or have hallucinations.

## 2017-12-30 NOTE — Progress Notes (Addendum)
  Progress Note    12/30/2017 11:19 AM * No surgery found *  Subjective:  Sedated this morning; incomprehensible mumbling when asked a question   Vitals:   12/30/17 0725 12/30/17 0800  BP:  106/76  Pulse:  64  Resp:  14  Temp: 97.7 F (36.5 C)   SpO2:  98%   Physical Exam: Lungs:  Non labored Extremities:  Symmetrical pedal pulses Abdomen:  Soft nontender; +BS Neurologic: sedated  CBC    Component Value Date/Time   WBC 10.6 (H) 12/30/2017 0252   RBC 4.29 12/30/2017 0252   HGB 13.0 12/30/2017 0252   HCT 37.6 (L) 12/30/2017 0252   PLT 415 (H) 12/30/2017 0252   MCV 87.6 12/30/2017 0252   MCH 30.3 12/30/2017 0252   MCHC 34.6 12/30/2017 0252   RDW 12.4 12/30/2017 0252   LYMPHSABS 2.0 12/27/2017 0123   MONOABS 1.1 (H) 12/27/2017 0123   EOSABS 0.2 12/27/2017 0123   BASOSABS 0.1 12/27/2017 0123    BMET    Component Value Date/Time   NA 138 12/28/2017 0102   K 4.1 12/28/2017 0102   CL 106 12/28/2017 0102   CO2 24 12/28/2017 0102   GLUCOSE 104 (H) 12/28/2017 0102   BUN 8 12/28/2017 0102   CREATININE 0.80 12/28/2017 0102   CALCIUM 8.7 (L) 12/28/2017 0102   GFRNONAA >60 12/28/2017 0102   GFRAA >60 12/28/2017 0102    INR No results found for: INR   Intake/Output Summary (Last 24 hours) at 12/30/2017 1119 Last data filed at 12/30/2017 0800 Gross per 24 hour  Intake 561.4 ml  Output 1600 ml  Net -1038.6 ml     Assessment/Plan:  37 y.o. male with abd aortic dissection, HTN  Benign abd exam; plan is for repeat CTA chest/abd/pelvis in 3 months No sign of malperfusion currently Home when HTN controlled with p.o. meds Ok with Hospitalist service assuming the attending role  Dagoberto Ligas, PA-C [facility] [phone] 12/30/2017 11:19 AM  Agitated he is in the hospital. Complains of abdominal pain but no real change Agree with above. D/c home when BP well controlled on oral meds  Ruta Hinds, MD [facility]  [place] Office: [phone] Pager: [phone]

## 2017-12-30 NOTE — Progress Notes (Signed)
12/29/2017 2100: Pt experienced extreme heartburn last night. PRN mylanta administered. PRN order for TUMS also gained and administered.  Unrelieved.  Pt describing as chest pressure. Performed EKG. No noted changes from previous. Pt now asleep after antianxiety/agitation and pain meds.  WCTM.   12/30/2017 0230: Lab woke pt for AM draws. Pt experienced episode of emesis. Zofran administered. Pt fell back asleep. WCTM.   0600: Pt experiencing emesis once more. No due PRN to administer. Pt stated that emesis is directly related to fast water intake. Given cool towel for head. Environmental changes made. Pt resting comfortably.

## 2017-12-31 LAB — LIPASE, BLOOD: Lipase: 23 U/L (ref 11–51)

## 2017-12-31 LAB — CBC
HCT: 38.1 % — ABNORMAL LOW (ref 39.0–52.0)
Hemoglobin: 13.1 g/dL (ref 13.0–17.0)
MCH: 30.5 pg (ref 26.0–34.0)
MCHC: 34.4 g/dL (ref 30.0–36.0)
MCV: 88.6 fL (ref 78.0–100.0)
Platelets: 372 10*3/uL (ref 150–400)
RBC: 4.3 MIL/uL (ref 4.22–5.81)
RDW: 12.9 % (ref 11.5–15.5)
WBC: 10.1 10*3/uL (ref 4.0–10.5)

## 2017-12-31 LAB — COMPREHENSIVE METABOLIC PANEL
ALT: 9 U/L — ABNORMAL LOW (ref 17–63)
AST: 13 U/L — ABNORMAL LOW (ref 15–41)
Albumin: 3.5 g/dL (ref 3.5–5.0)
Alkaline Phosphatase: 67 U/L (ref 38–126)
Anion gap: 7 (ref 5–15)
BUN: 5 mg/dL — ABNORMAL LOW (ref 6–20)
CO2: 26 mmol/L (ref 22–32)
Calcium: 8.8 mg/dL — ABNORMAL LOW (ref 8.9–10.3)
Chloride: 106 mmol/L (ref 101–111)
Creatinine, Ser: 0.7 mg/dL (ref 0.61–1.24)
GFR calc Af Amer: >60 mL/min (ref >60)
GFR calc non Af Amer: >60 mL/min (ref >60)
Glucose, Bld: 98 mg/dL (ref 65–99)
Potassium: 3.3 mmol/L — ABNORMAL LOW (ref 3.5–5.1)
Sodium: 139 mmol/L (ref 135–145)
Total Bilirubin: 0.9 mg/dL (ref 0.3–1.2)
Total Protein: 6.6 g/dL (ref 6.5–8.1)

## 2017-12-31 LAB — CORTISOL: Cortisol, Plasma: 4.4 ug/dL

## 2017-12-31 LAB — TSH: TSH: 4.877 u[IU]/mL — ABNORMAL HIGH (ref 0.350–4.500)

## 2017-12-31 MED ORDER — METOPROLOL TARTRATE 100 MG PO TABS: 100.0000 mg | ORAL_TABLET | Freq: Two times a day (BID) | ORAL | 0 refills | Status: DC

## 2017-12-31 MED ORDER — HYDRALAZINE HCL 50 MG PO TABS: 50.0000 mg | ORAL_TABLET | Freq: Three times a day (TID) | ORAL | 0 refills | Status: DC

## 2017-12-31 MED ORDER — OXYCODONE-ACETAMINOPHEN 5-325 MG PO TABS: 1.0000 | ORAL_TABLET | Freq: Four times a day (QID) | ORAL | 0 refills | Status: DC | PRN

## 2017-12-31 MED ORDER — CLONIDINE HCL 0.2 MG PO TABS: 0.2000 mg | ORAL_TABLET | Freq: Three times a day (TID) | ORAL | 0 refills | Status: DC

## 2017-12-31 MED ORDER — HYDROXYZINE HCL 25 MG PO TABS: 25.0000 mg | ORAL_TABLET | ORAL | 0 refills | Status: DC | PRN

## 2017-12-31 NOTE — Care Management Note (Signed)
Case Management Note Marvetta Gibbons RN,BSN Unit 2H 1-22 Case Manager (weekend coverage) [phone]  Patient Details  Name: Chad Johnson MRN: [id number] Date of Birth: 1981-05-06  Subjective/Objective:  Pt admitted with aortic dissection,  HTN- plan to tx medically at this time- vascular to f/u ion 3 mo.                  Action/Plan: PTA pt lived at home- independent- referral for PCP and medication needs- spoke with pt at bedside per pt he lives in [place] provided to pt on clinics in [place] along with instructions to call [facility] tomorrow am and ask for f/u appointment on d/c from hospital over weekend. Pt also provided [place] letter for medication assistance along with list of pharmacies to use explained copay cost $3 per script and no controlled medication coverage.  Pt appears scattered in thought and anxious to leave (states he is listening and understands instructions for PCP and [facility] letter)- SO other at bedside to also hear instructions and will assist pt on transition home.   Expected Discharge Date:  12/31/17               Expected Discharge Plan:  Home/Self Care  In-House Referral:     Discharge planning Services  CM Consult, [place], [facility], Medication Assistance  Post Acute Care Choice:  NA Choice offered to:  Patient  DME Arranged:    DME Agency:     HH Arranged:    [place] Agency:     Status of Service:  Completed, signed off  If discussed at [employer] of Stay Meetings, dates discussed:    Discharge Disposition: home/self care   Additional Comments:  Dawayne Patricia, RN 12/31/2017, 12:55 PM

## 2017-12-31 NOTE — Progress Notes (Signed)
Wife reports that the pt is hallucinating  and talking to a "drill sargent" and getting agitated. The wife was able to redirect pt and explain to him no one else was in the room. Wife says that she notice that he talks to himself more after receiving ativan. However, pt has not slept at all during this shift. RN noticed pt dozing off, but not allowing himself to sleep. He kept saying he wanted to go home and was not comfortable in the bed. Offered ways to help pt to get comfortable, pt refused interventions.

## 2017-12-31 NOTE — Discharge Instructions (Signed)
Abdominal Aortic Aneurysm An aneurysm is a bulge in an artery. It happens when blood pushes up against a weakened or damaged artery wall. An abdominal aortic aneurysm is an aneurysm that occurs in the lower part of the aorta, the main artery of the body. The aorta supplies blood from the heart to the rest of the body. Some aneurysms may not cause symptoms or problems. However, the major concern with an abdominal aortic aneurysm is that it can enlarge and burst (rupture), or that blood can flow between the layers of the wall of the aorta through a tear (aorticdissection). Both of these conditions can cause bleeding inside the body and can be life-threatening unless diagnosed and treated right away. What are the causes? The exact cause of this condition is not known. What increases the risk? The following factors may make you more likely to develop this condition:  Being older than age 75.  Having a hardening of the arteries caused by the buildup of fat and other substances in the lining of a blood vessel (arteriosclerosis).  Having inflammation of the walls of an artery (arteritis).  Having a genetic disease that weakens the body's connective tissue, such as Marfan syndrome.  Having abdominal trauma.  Having an infection, such as syphilis or staphylococcus, in the wall of the aorta (infectious aortitis) caused by bacteria.  Having high blood pressure (hypertension).  Being male.  Being white (Caucasian).  Having high cholesterol.  Having a family history of aneurysms.  Using tobacco.  Having chronic obstructive pulmonary disease (COPD).  What are the signs or symptoms? Symptoms of this condition vary depending on the size and rate of growth of the aneurysm.Most aneurysms grow slowly and do not cause any symptoms. When symptoms do occur, they may include:  Pain in the abdomen, side, or lower back. The pain may vary in intensity.  Feeling full after eating only small amounts of  food.  Feeling a pulsating lump in the abdomen.  Symptoms that the aneurysm has ruptured include:  A sudden onset of severe pain in the abdomen, side, or lower back.  Nausea or vomiting.  Feeling faint or passing out.  How is this diagnosed? This condition may be diagnosed with:  A physical exam. During the exam, your health care provider will check for throbbing in your abdomen. He or she may also listen to the blood flow in your abdomen.  Tests, such as: ? An ultrasound. ? X-rays. ? A CT scan. ? An MRI. ? Tests to check your arteries for damage or blockage (angiogram).  Because most unruptured abdominal aortic aneurysms cause no symptoms, they are often found during exams for other conditions. How is this treated? Treatment for this condition depends on:  The size of the aneurysm.  How fast the aneurysm is growing.  Your age.  Risk factors for rupture.  Aneurysms that are smaller than 2 inches (5 cm) may be managed by using medicines to control blood pressure, manage pain, or fight infection. You may need regular monitoring to see if the aneurysm is getting bigger. Your health care provider may recommend that you have an ultrasound every few years, every year, or every 3-6 months. How often you need to have an ultrasound depends on the size of the aneurysm, how fast it is growing, and whether you have a family history of aneurysms. Surgical repair may be needed if your aneurysm is larger than 2 inches (5 cm). Follow these instructions at home: General instructions  Keep all  follow-up visits as told by your health care provider. This is important. ? Talk to your health care provider about regular screenings to see if the aneurysm is getting bigger.  Take over-the-counter and prescription medicines only as told by your health care provider.  Avoid heavy lifting and activities that take a lot of effort (are strenuous). Ask your health care provider what activities are  safe for you. Lifestyle Follow instructions from your health care provider about healthy lifestyle habits. Your health care provider may recommend:  Not using any products that contain nicotine or tobacco, such as cigarettes and e-cigarettes. If you need help quitting, ask your health care provider.  Limiting or avoiding alcohol.  Keeping your blood pressure within normal limits. The target limit for most people is below 120/80. Check your blood pressure regularly. If it is high, ask your health care provider about ways that you can control it.  Keeping your blood sugar level and cholesterol levels within normal limits. Target limits for most people are: ? Blood sugar level: Less than 100 mg/dL. ? Total cholesterol level: Less than 200 mg/dL.  Eating a healthy diet. This may include: ? Lowering your salt (sodium) intake. In some people, too much salt can raise blood pressure and increase the risk of abdominal aortic aneurysm. ? Avoiding foods that are high in saturated fat and cholesterol, such as red meat and dairy products. ? Eating a diet that is low in sugar. ? Increasing your fiber intake by including whole grains, vegetables, and fruits in your diet. Eating these foods may help to lower blood pressure.  Maintaining a healthy weight.  Staying physically active and exercising regularly. Talk with your health care provider about how often you should exercise and which types of exercise are safe for you.  Contact a health care provider if:  You have pain in your abdomen, side, or lower back.  You have a throbbing feeling in your abdomen.  You have a family history of aneurysms. Get help right away if:  You have sudden, severe pain in your abdomen or lower back.  You experience nausea or vomiting.  You have constipation or problems urinating.  You feel light-headed.  You have a rapid heart rate when you stand.  You have sweaty, clammy skin.  You have shortness of  breath.  You have a fever. This information is not intended to replace advice given to you by your health care provider. Make sure you discuss any questions you have with your health care provider. Document Released: 04/20/2005 Document Revised: 02/03/2016 Document Reviewed: 12/29/2015 Elsevier Interactive Patient Education  2018 [employer]      Abdominal Aortic Aneurysm Endograft Repair Abdominal aortic aneurysm endograft repair is a surgery to fix an aortic aneurysm in the abdominal area. An aneurysm is a weak or damaged part of an artery wall that bulges out from the normal force of blood pumping through the body. An abdominal aortic aneurysm is an aneurysm that happens in the lower part of the aorta, which is the main artery of the body. The repair is often done if the aneurysm gets so large that it might burst (rupture). A ruptured aneurysm would cause bleeding inside the body that could put a person's life in danger. Before that happens, this procedure is needed to fix the problem. The procedure may also be done if the aneurysm causes symptoms such as pain in the back, abdomen, or side. In this procedure, a tube made of fabric and metal mesh (  endograft or stent-graft) is placed in the weak part of the aorta to repair it. Tell a health care provider about:  Any allergies you have.  All medicines you are taking, including vitamins, herbs, eye drops, creams, and over-the-counter medicines.  Any problems you or family members have had with anesthetic medicines.  Any blood disorders you have.  Any surgeries you have had.  Any medical conditions you have.  Whether you are pregnant or may be pregnant. What are the risks? Generally, this is a safe procedure. However, problems may occur, including:  Infection of the graft or incision area.  Bleeding during the procedure or from the incision site.  Allergic reactions to medicines.  Damage to other structures or organs.  Blood  leaking out around the endograft.  The endograft moving from where it was placed during surgery.  Blood flow through the graft becoming blocked.  Blood clots.  Kidney problems.  Blood flow to the legs becoming blocked (rare).  Rupture of the aorta even after the endograft repair is a success (rare).  Abdominal Aortic Aneurysm Open Repair Abdominal aortic aneurysm open repair is a surgery to fix an aortic aneurysm in the abdominal area. An aneurysm is a weak or damaged part of an artery wall that bulges out from the normal force of blood pumping through the body. An abdominal aortic aneurysm is an aneurysm that happens in the lower part of the aorta, which is the main artery of the body. Surgery is often done if there is a high risk that the aneurysm might burst (rupture). The bigger the aneurysm gets, the more chance there is that it will rupture. A ruptured aneurysm can cause bleeding inside the body that could put a person's life in danger. Before that happens, surgery is needed to fix the problem. Surgery may also be done if the aneurysm causes symptoms such as pain in the back, abdomen, or side. In abdominal aortic aneurysm open repair, a tube-shaped piece of man-made material (aortic graft) is placed in the damaged or weak part of the aorta to repair it. Tell a health care provider about:  Any allergies you have.  All medicines you are taking, including vitamins, herbs, eye drops, creams, and over-the-counter medicines.  Any problems you or family members have had with anesthetic medicines.  Any blood disorders you have.  Any surgeries you have had.  Any medical conditions you have, including any recent symptoms of colds or infections.  Whether you are pregnant or may be pregnant. What are the risks? Generally, this is a safe procedure. However, problems may occur, including:  Infection.  Bleeding.  Allergic reactions to medicines.  Lung problems.  Intestine  problems.  Heart attack.  Blood clots.  Very low blood pressure.  Kidney damage.  Nerve damage that can cause pain or numbness in the leg.

## 2017-12-31 NOTE — Discharge Summary (Addendum)
DISCHARGE SUMMARY  Chad Johnson  MR#: [id number]  DOB:07-Aug-1980  Date of Admission: 12/27/2017 Date of Discharge: 12/31/2017  Attending Physician:Jeffrey Hennie Duos, MD  Patient's BZJ:IRCVELF, No Pcp Per  Consults: [facility]  Disposition: D/C home   Follow-up Appts: Follow-up Information    Angelia Mould, MD In 3 months.   Specialties:  Vascular Surgery, Cardiology Why:  Office will call you to arrange your appt (sent) Contact information: [address] [place] [address] [phone]          -CM to assure pt has f/u in a medical clinic for a BP check within 5-7 days prior to d/c home  Tests Needing Follow-up: -will need repeat CTangio of adom/pelvis in 3 months (to be arranged per [facility]) -assess for BP control -assess for generalized anxiety disorder and consider referral for counseling - avoid benzos in pt w/ remote hx of substance abuse   Discharge Diagnoses: Aortic dissection Uncontrolled HTN Agitation Toxic metabolic encephalopathy Severe anxiety / Generalized Anxiety Disorder  Initial presentation: 37yo M w/ HTN and anxiety who presented to the hospital w/ acute onset abdom pain and was 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 The Vascular Surgery Service requested medical consultation to establish an oral antihypertensive regimen, w/ a goal systolic blood pressure of < 120.  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 w/  incredibly heavy daily marijuana use - weaned off precedex - stopped haldol as it does not appear to help - scheduled anxiolytic utilized during stay - discussed outpt tx w/ patient who was honest in admitting his intention to use daily marijuan upon returning home - w/ a remote hx of polysusbtance abuse, I do not wish to send him home on benzos - in that he will be self-treating at home I do not feel ongoing use of seroquel is appropriate - I have counseled him at length in regard to the connection between his anxiety and uncontrolled HTN - I have advised him to seek outpt Psychiatric or Phycological counseling   Allergies as of 12/31/2017   No Known Allergies     Medication List    STOP taking these medications   aspirin EC 81 MG tablet   cetirizine 10 MG tablet Commonly known as:  ZYRTEC     TAKE these medications   cloNIDine 0.2 MG tablet Commonly known as:  CATAPRES Take 1 tablet (0.2 mg total) by mouth 3 (three) times daily.   fexofenadine-pseudoephedrine 180-240 MG 24 hr tablet Commonly known as:  ALLEGRA-D 24 Take 1 tablet by mouth daily.   fluticasone 50 MCG/ACT nasal spray Commonly known as:  FLONASE Place 2 sprays into both nostrils daily.   hydrALAZINE 50 MG tablet Commonly known as:  APRESOLINE Take 1 tablet (50 mg total) by mouth 3 (three) times daily.   hydrOXYzine 25 MG tablet Commonly known as:  ATARAX/VISTARIL Take 1 tablet (25 mg total) by mouth every 4 (  four) hours as needed for anxiety (refractory anxiety).   metoprolol tartrate 100 MG tablet Commonly known as:  LOPRESSOR Take 1 tablet (100 mg total) by mouth 2 (two) times daily.   oxyCODONE-acetaminophen 5-325 MG tablet Commonly known as:  PERCOCET/ROXICET Take 1-2 tablets by mouth every 6 (six) hours as needed for moderate pain.       Day of Discharge BP 113/64   Pulse 84   Temp 98.2 F (36.8 C) (Oral)   Resp (!) 21   Ht 6\' 2"  (1.88 m)   Wt 75.1 kg (165 lb 9.1 oz)   SpO2 99%   BMI 21.26  kg/m   Physical Exam: General: No acute respiratory distress Lungs: Clear to auscultation bilaterally without wheezes or crackles Cardiovascular: Regular rate and rhythm without murmur gallop or rub normal S1 and S2 Abdomen: Nondistended, soft, bowel sounds positive, no rebound, no ascites, no appreciable mass Extremities: No significant cyanosis, clubbing, or edema bilateral lower extremities  Basic Metabolic Panel: Recent Labs  Lab 12/25/17 1345 12/26/17 0337 12/27/17 0123 12/28/17 0102 12/31/17 0625  NA 138 137 138 138 139  K 3.3* 3.5 3.4* 4.1 3.3*  CL 106 105 102 106 106  CO2 20* 23 26 24 26   GLUCOSE 117* 104* 108* 104* 98  BUN 10 6 8 8  5*  CREATININE 0.69 0.73 0.76 0.80 0.70  CALCIUM 9.3 8.4* 9.2 8.7* 8.8*    Liver Function Tests: Recent Labs  Lab 12/25/17 1345 12/27/17 0123 12/31/17 0625  AST 21 19 13*  ALT 12* 13* 9*  ALKPHOS 69 69 67  BILITOT 1.0 0.7 0.9  PROT 7.8 7.5 6.6  ALBUMIN 4.6 4.3 3.5   Recent Labs  Lab 12/25/17 1345 12/27/17 0123 12/31/17 0625  LIPASE 24 21 23     CBC: Recent Labs  Lab 12/25/17 1345  12/27/17 0123 12/28/17 0102 12/29/17 0259 12/30/17 0252 12/31/17 0625  WBC 15.4*   < > 15.4* 11.9* 10.6* 10.6* 10.1  NEUTROABS 13.9*  --  12.2*  --   --   --   --   HGB 14.7   < > 14.7 12.9* 13.3 13.0 13.1  HCT 42.8   < > 42.2 37.7* 38.9* 37.6* 38.1*  MCV 90.6   < > 86.5 87.9 90.0 87.6 88.6  PLT 414   < > 438* 364 351 415* 372   < > = values in this interval not displayed.    Cardiac Enzymes: Recent Labs  Lab 12/25/17 1345  TROPONINI <0.03    CBG: Recent Labs  Lab 12/25/17 1753  GLUCAP 129*    Recent Results (from the past 240 hour(s))  MRSA PCR Screening     Status: None   Collection Time: 12/25/17  6:04 PM  Result Value Ref Range Status   MRSA by PCR NEGATIVE NEGATIVE Final    Comment:        The GeneXpert MRSA Assay (FDA approved for NASAL specimens only), is one component of a comprehensive MRSA  colonization surveillance program. It is not intended to diagnose MRSA infection nor to guide or monitor treatment for MRSA infections. Performed at [facility], [address] [place], [place] [address]      Time spent in discharge (includes decision making & examination of pt): 35 minutes  12/31/2017, 10:51 AM   Cherene Altes, MD [facility] Office  [phone] Pager [phone]  On-Call/Text Page:      [email or web address]      password [facility]

## 2018-01-01 ENCOUNTER — Telehealth: Payer: Self-pay | Admitting: Vascular Surgery

## 2018-01-01 NOTE — Telephone Encounter (Signed)
sch appt spk to pt wife 03/28/18 11am CTA 1230pm MD

## 2018-01-10 ENCOUNTER — Encounter: Payer: Self-pay | Admitting: Internal Medicine

## 2018-01-10 ENCOUNTER — Ambulatory Visit: Payer: Medicaid Other | Attending: Internal Medicine | Admitting: Internal Medicine

## 2018-01-10 DIAGNOSIS — R5383 Other fatigue: Secondary | ICD-10-CM | POA: Diagnosis not present

## 2018-01-10 DIAGNOSIS — Z79899 Other long term (current) drug therapy: Secondary | ICD-10-CM | POA: Diagnosis not present

## 2018-01-10 DIAGNOSIS — I7102 Dissection of abdominal aorta: Secondary | ICD-10-CM

## 2018-01-10 DIAGNOSIS — Z7982 Long term (current) use of aspirin: Secondary | ICD-10-CM | POA: Diagnosis not present

## 2018-01-10 DIAGNOSIS — I10 Essential (primary) hypertension: Secondary | ICD-10-CM | POA: Diagnosis present

## 2018-01-10 MED ORDER — TRAMADOL HCL 50 MG PO TABS: 50.0000 mg | ORAL_TABLET | Freq: Three times a day (TID) | ORAL | 0 refills | Status: DC | PRN

## 2018-01-10 MED ORDER — POLYETHYLENE GLYCOL 3350 17 GM/SCOOP PO POWD: 17.0000 g | ORAL | 1 refills | Status: DC | PRN

## 2018-01-10 MED ORDER — METOPROLOL TARTRATE 100 MG PO TABS: 50.0000 mg | ORAL_TABLET | Freq: Two times a day (BID) | ORAL | 0 refills | Status: DC

## 2018-01-10 MED ORDER — ASPIRIN EC 81 MG PO TBEC: 81.0000 mg | DELAYED_RELEASE_TABLET | Freq: Every day | ORAL | 2 refills | Status: DC

## 2018-01-10 NOTE — Assessment & Plan Note (Signed)
Prev accelerated hypertension in setting of acute descending aortic dissection - now with low BP/HR and overwhelming fatigue. Will titrate down on multi-rx therapy, starting with reduction of beta-blocker by 1/2 - PT/spouse (Courtney) to monitor BP/HR with home cuff and call if continued SBP<100 and HR<70

## 2018-01-10 NOTE — Assessment & Plan Note (Signed)
Multifactorial - and associated with weight loss from poor intake - reduced narcotics, reduce antiHTN and encourage aggressive bowel regimen with home Miralax q3h prn

## 2018-01-10 NOTE — Assessment & Plan Note (Signed)
DX by CT angio 12/25/17 - treated with aggressive hypertension management and observation - See above re: reduction of antiHTN due to low BP/HR - Continued abdominal pain but not changed from discharge and complicated by no BM in >5HQIO- recommended tx pain with tramadol and tylenol prn. Education on Ao dissection and needed [facility] follow up for continued monitoring

## 2018-01-10 NOTE — Progress Notes (Signed)
Hospital f/u  Hurt all day everyday Very minimal  BM x 3 weeks after enema and miralax. Has intermittent abdominal pain 8/10 Nauseated "Feels like I have the flu but no cough, I have no energy."  He also has concerns about his arm from venipuncture in the hospital.
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
## 2018-01-10 NOTE — Patient Instructions (Addendum)
It was good to see you today.  We have reviewed your prior records including labs and tests today  Please sign up for MyChart as discussed to review your own test results  Medications reviewed and updated 1) reduce metoprolol to HALF tab twice daily for total dose of 50mg  (1/2 of 100mg ) twice daily 2) start baby aspirin once daily 3) tramadol as needed for pain along with Tylenol as needed 4) take Miralx with gatorade every 3 hours as needed until bowel movement occurs  No diet restrictions  Call or (write to this clinic on MyChart) if blood pressure stays less than 100/60 or pulse (heart rate) stays under 70 for other instructions on reducing blood pressure medication dosing as discussed   Abdominal Aortic Aneurysm Blood pumps away from the heart through tubes (blood vessels) called arteries. Aneurysms are weak or damaged places in the wall of an artery. It bulges out like a balloon. An abdominal aortic aneurysm happens in the main artery of the body (aorta). It can burst or tear, causing bleeding inside the body. This is an emergency. It needs treatment right away. What are the causes? The exact cause is unknown. Things that could cause this problem include:  Fat and other substances building up in the lining of a tube.  Swelling of the walls of a blood vessel.  Certain tissue diseases.  Belly (abdominal) trauma.  An infection in the main artery of the body.  What increases the risk? There are things that make it more likely for you to have an aneurysm. These include:  Being over the age of 38 years old.  Having high blood pressure (hypertension).  Being a male.  Being white.  Being very overweight (obese).  Having a family history of aneurysm.  Using tobacco products.  What are the signs or symptoms? Symptoms depend on the size of the aneurysm and how fast it grows. There may not be symptoms. If symptoms occur, they can include:  Pain (belly, side, lower back,  or groin).  Feeling full after eating a small amount of food.  Feeling sick to your stomach (nauseous), throwing up (vomiting), or both.  Feeling a lump in your belly that feels like it is beating (pulsating).  Feeling like you will pass out (faint).  How is this treated?  Medicine to control blood pressure and pain.  Imaging tests to see if the aneurysm gets bigger.  Surgery. How is this prevented? To lessen your chance of getting this condition:  Stop smoking. Stop chewing tobacco.  Limit or avoid alcohol.  Keep your blood pressure, blood sugar, and cholesterol within normal limits.  Eat less salt.  Eat foods low in saturated fats and cholesterol. These are found in animal and whole dairy products.  Eat more fiber. Fiber is found in whole grains, vegetables, and fruits.  Keep a healthy weight.  Stay active and exercise often.  This information is not intended to replace advice given to you by your health care provider. Make sure you discuss any questions you have with your health care provider. Document Released: 11/05/2012 Document Revised: 12/17/2015 Document Reviewed: 08/10/2012 Elsevier Interactive Patient Education  2017 [employer].

## 2018-01-11 ENCOUNTER — Encounter: Payer: Self-pay | Admitting: Internal Medicine

## 2018-01-15 ENCOUNTER — Telehealth: Payer: Self-pay | Admitting: General Practice

## 2018-01-15 ENCOUNTER — Encounter: Payer: Self-pay | Admitting: Internal Medicine

## 2018-01-15 MED ORDER — CLONIDINE HCL 0.2 MG PO TABS: 0.1000 mg | ORAL_TABLET | Freq: Three times a day (TID) | ORAL | 0 refills | Status: DC

## 2018-01-15 NOTE — Telephone Encounter (Signed)
Patient called asking for a call back about medication the patient stated that his BP and his heart rate is super low. He stated that on  01/10/18 dr Asa Lente changed his lopressor he thinks he it needs to be changed again

## 2018-01-15 NOTE — Telephone Encounter (Signed)
PC returned to pt. He saw Dr. Asa Lente 01/10/2018 and will be seeing Dr. Margarita Rana in August.  He reports his BP and pulse still running low and still very fatigue despite Metoprolol dose being cut in 1/2 on last visit with Leschber.  BP running 90/50-112/65 and pulse in the 60s.  I recommend decrease Clonidine from 0.2 mg TID to 0.1 mg TID.  He will cut the 0.2 mg tablets in half.   advised to continue monitoring blood pressure and pulse and checking again in about 3 days to let us know how things are going.  He expressed understanding.

## 2018-01-19 ENCOUNTER — Telehealth: Payer: Self-pay | Admitting: General Practice

## 2018-01-19 NOTE — Telephone Encounter (Signed)
Patient called to let provider, bp running 105/67 p 60, patient stated that is pretty much his average. Patient complied with last note regarding cutting medication in half on the .2mg  for clonidine.Patient stated that he slightly feel better but is still concerned about the readings. Please fu at your earliest convenience.

## 2018-01-22 NOTE — Telephone Encounter (Signed)
I have reviewed Dr. Katheren Puller note and the plan was to follow-up with vein and vascular surgery, optimization of BP and I would recommend that as well.  I will see him at his appointment and discuss other concerns then.

## 2018-01-22 NOTE — Telephone Encounter (Signed)
Patient was seen by Dr.Leschber and was informed that she would refer him to Cardiology, patient has not heard anything. Are we able to refer him before he establish with you as a PCP.

## 2018-01-29 ENCOUNTER — Telehealth: Payer: Self-pay | Admitting: General Practice

## 2018-01-29 NOTE — Telephone Encounter (Signed)
Patient called asking for his hydrALAZINE (APRESOLINE) 50 MG tablet [[id number] cloNIDine (CATAPRES) 0.2 MG tablet [[id number]] and metoprolol tartrate (LOPRESSOR) 100 MG tablet [[id number]]  To be filled if not please lt the patient know.   Patient wants to know can you cut out any medication.

## 2018-01-29 NOTE — Telephone Encounter (Signed)
I will see him at his upcoming visit

## 2018-01-29 NOTE — Telephone Encounter (Signed)
Dr. Margarita Rana-  Pt called requesting refills on hydralazine, metoprolol and clonidine.   Of note, states home pressures range 110s/60s. States that he doesn't want to take "all of this medication". Reinforced that his regimen is recommended per Vascular surgery to keep SBP < 120 mmHg and taking his medication as prescribed is important. He states that he would like to know if he could decrease or come off of anything. I told the patient I would forward this information to you but you would most likely need to assess him at upcoming appointment.   Has no concerns about side effects or dizziness. Does endorse fatigue.

## 2018-01-30 ENCOUNTER — Other Ambulatory Visit: Payer: Self-pay | Admitting: *Deleted

## 2018-01-30 MED ORDER — HYDRALAZINE HCL 50 MG PO TABS: 50.0000 mg | ORAL_TABLET | Freq: Three times a day (TID) | ORAL | 0 refills | Status: DC

## 2018-01-30 NOTE — Telephone Encounter (Signed)
Patient has an appointment the beginning of august. Refill placed for one month.

## 2018-02-19 ENCOUNTER — Telehealth: Payer: Self-pay | Admitting: General Practice

## 2018-02-19 MED ORDER — METOPROLOL TARTRATE 100 MG PO TABS: 50.0000 mg | ORAL_TABLET | Freq: Two times a day (BID) | ORAL | 0 refills | Status: DC

## 2018-02-19 NOTE — Telephone Encounter (Signed)
Refilled

## 2018-02-19 NOTE — Telephone Encounter (Signed)
Pt called to request a medication refill -metoprolol tartrate (LOPRESSOR) 100 MG tablet  To -[id number] - [place], [place] - [address] Until his next office visit, Please follow up

## 2018-02-22 ENCOUNTER — Ambulatory Visit: Payer: Self-pay | Attending: Family Medicine | Admitting: Family Medicine

## 2018-02-22 ENCOUNTER — Encounter: Payer: Self-pay | Admitting: Family Medicine

## 2018-02-22 VITALS — BP 109/69 | HR 78 | Temp 98.5°F | Ht 74.0 in | Wt 176.2 lb

## 2018-02-22 DIAGNOSIS — Z79899 Other long term (current) drug therapy: Secondary | ICD-10-CM | POA: Insufficient documentation

## 2018-02-22 DIAGNOSIS — R5383 Other fatigue: Secondary | ICD-10-CM | POA: Insufficient documentation

## 2018-02-22 DIAGNOSIS — I10 Essential (primary) hypertension: Secondary | ICD-10-CM | POA: Insufficient documentation

## 2018-02-22 DIAGNOSIS — R413 Other amnesia: Secondary | ICD-10-CM | POA: Insufficient documentation

## 2018-02-22 DIAGNOSIS — N400 Enlarged prostate without lower urinary tract symptoms: Secondary | ICD-10-CM | POA: Insufficient documentation

## 2018-02-22 DIAGNOSIS — I7102 Dissection of abdominal aorta: Secondary | ICD-10-CM

## 2018-02-22 DIAGNOSIS — Z7982 Long term (current) use of aspirin: Secondary | ICD-10-CM | POA: Insufficient documentation

## 2018-02-22 DIAGNOSIS — F411 Generalized anxiety disorder: Secondary | ICD-10-CM | POA: Insufficient documentation

## 2018-02-22 DIAGNOSIS — E876 Hypokalemia: Secondary | ICD-10-CM | POA: Insufficient documentation

## 2018-02-22 DIAGNOSIS — I719 Aortic aneurysm of unspecified site, without rupture: Secondary | ICD-10-CM | POA: Insufficient documentation

## 2018-02-22 MED ORDER — METOPROLOL TARTRATE 50 MG PO TABS: 50.0000 mg | ORAL_TABLET | Freq: Two times a day (BID) | ORAL | 3 refills | Status: DC

## 2018-02-22 MED ORDER — CLONIDINE HCL 0.1 MG PO TABS: 0.1000 mg | ORAL_TABLET | Freq: Every day | ORAL | 3 refills | Status: DC

## 2018-02-22 MED ORDER — HYDRALAZINE HCL 50 MG PO TABS: 50.0000 mg | ORAL_TABLET | Freq: Three times a day (TID) | ORAL | 3 refills | Status: DC

## 2018-02-22 NOTE — Patient Instructions (Signed)
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
## 2018-02-23 LAB — BASIC METABOLIC PANEL
BUN/Creatinine Ratio: 14 (ref 9–20)
BUN: 10 mg/dL (ref 6–20)
CO2: 22 mmol/L (ref 20–29)
Calcium: 9.7 mg/dL (ref 8.7–10.2)
Chloride: 103 mmol/L (ref 96–106)
Creatinine, Ser: 0.74 mg/dL — ABNORMAL LOW (ref 0.76–1.27)
GFR calc non Af Amer: 118 mL/min/{1.73_m2} (ref >59)
GFR, EST AFRICAN AMERICAN: 136 mL/min/{1.73_m2} (ref >59)
Glucose: 91 mg/dL (ref 65–99)
POTASSIUM: 4.1 mmol/L (ref 3.5–5.2)
SODIUM: 142 mmol/L (ref 134–144)

## 2018-03-08 ENCOUNTER — Ambulatory Visit: Payer: Self-pay | Attending: Family Medicine | Admitting: Pharmacist

## 2018-03-08 VITALS — BP 103/63 | HR 51

## 2018-03-08 DIAGNOSIS — F411 Generalized anxiety disorder: Secondary | ICD-10-CM | POA: Insufficient documentation

## 2018-03-08 DIAGNOSIS — Z87891 Personal history of nicotine dependence: Secondary | ICD-10-CM | POA: Insufficient documentation

## 2018-03-08 DIAGNOSIS — R001 Bradycardia, unspecified: Secondary | ICD-10-CM | POA: Insufficient documentation

## 2018-03-08 DIAGNOSIS — I10 Essential (primary) hypertension: Secondary | ICD-10-CM

## 2018-03-08 DIAGNOSIS — Z8249 Family history of ischemic heart disease and other diseases of the circulatory system: Secondary | ICD-10-CM | POA: Insufficient documentation

## 2018-03-08 NOTE — Patient Instructions (Addendum)
Thank you for coming to see [place] today. Please stop clonidine. Continue taking metoprolol and hydralazine. Also, try to continue taking blood pressures at home and call us if your top number is above 120.   My number is: [phone].

## 2018-03-08 NOTE — Progress Notes (Signed)
   S:    PCP: [place]   37 YO male with a PMH of aortic dissection with SBP goal <120 and GAD presents today for BP check. Patient was referred by Dr. Margarita Rana and last seen by her on 02/22/18.   Denies CP, SOB, HA, or dizziness. Reports that fatigue "may have improved" but he is uncertain. Reports SBPs in the 110s at home. He is concerned about his HR and states that his is the 50s at home.   02/22/18 w/ Dr. Margarita Rana BP at visit 109/69. BP log SBPs in low 100s. Wants to taper off medications. At visit, clonidine reduced from BID to qhs. Endorsed anxiety and frustration d/t limited activity and noted fatigue.   Patient reports adherence with medications.  Current BP Medications include:   - clonidine 0.1 mg qhs - hydralazine 50 mg TID - metoprolol tartrate 50 mg BID   Antihypertensives tried in the past include:  - clonidine 0.2 mg at various dosing schedules  - metoprolol tartrate 1000 mg BID  Family / Social history:  - FH: maternal grandfather (DM, heart disease, HTN, hyperlipidemia) - Tobacco: reports smoking cigarettes ~13 year. Reports quitting age 32.  - Alcohol:    Home BP readings:  - Reports SBPs 110s  O:  BP in R arm after 5 minutes rest: 103/63; HR 51 Last 3 Office BP readings: BP Readings from Last 3 Encounters:  02/22/18 109/69  01/10/18 (!) 91/57  12/31/17 125/82    BMET    Component Value Date/Time   NA 142 02/22/2018 1217   K 4.1 02/22/2018 1217   CL 103 02/22/2018 1217   CO2 22 02/22/2018 1217   GLUCOSE 91 02/22/2018 1217   GLUCOSE 98 12/31/2017 0625   BUN 10 02/22/2018 1217   CREATININE 0.74 (L) 02/22/2018 1217   CALCIUM 9.7 02/22/2018 1217   GFRNONAA 118 02/22/2018 1217   GFRAA 136 02/22/2018 1217    Renal function: Estimated Creatinine Clearance: 142.9 mL/min (A) (by C-G formula based on SCr of 0.74 mg/dL (L)).  A/P: Hypertension controlled on current medications. SBP goal <120 mmHg s/p aortic dissection diagnosed in 12/2017. Pt with low home BP  readings with reported bradycardia (reported HR in the 50s). BP today 103/63 with a HR of 51. Patient is adherent with medications.   Discussed with Dr. Margarita Rana. Fear of rebound hypertension with discontinuation of clonidine. However, pt's dose of clonidine has been decreased gradually and his BP remains low. Following Dr. Smitty Pluck recommendations, will have patient stop clonidine. Pt knows to monitor pressures at home and report any SBP > 120 to me.   -Discontinued clonidine.  -Continue checking BP at home with SBP goal < 120 mmHg  Results reviewed and written information provided. Total time in face-to-face counseling 15 minutes.   F/U Clinic Visit 04/11/18.   Patient seen with: Marylene Buerger, PharmD Candidate [employer]  Benard Halsted, PharmD, [place] [phone]

## 2018-03-19 ENCOUNTER — Other Ambulatory Visit: Payer: Self-pay | Admitting: Family Medicine

## 2018-03-28 ENCOUNTER — Ambulatory Visit
Admission: RE | Admit: 2018-03-28 | Discharge: 2018-03-28 | Disposition: A | Discharge status: Home / Self Care | Payer: Self-pay | Source: Ambulatory Visit | Attending: Vascular Surgery | Admitting: Vascular Surgery

## 2018-03-28 ENCOUNTER — Encounter: Payer: Self-pay | Admitting: Vascular Surgery

## 2018-03-28 ENCOUNTER — Ambulatory Visit (INDEPENDENT_AMBULATORY_CARE_PROVIDER_SITE_OTHER): Payer: Self-pay | Admitting: Vascular Surgery

## 2018-03-28 VITALS — BP 114/77 | HR 57 | Resp 16 | Ht 74.0 in | Wt 177.0 lb

## 2018-03-28 DIAGNOSIS — I71019 Dissection of thoracic aorta, unspecified: Secondary | ICD-10-CM

## 2018-03-28 DIAGNOSIS — I7101 Dissection of thoracic aorta: Secondary | ICD-10-CM

## 2018-03-28 MED ORDER — IOPAMIDOL (ISOVUE-370) INJECTION 76%
75.0000 mL | Freq: Once | INTRAVENOUS | Status: AC | PRN
Start: 2018-03-28 — End: 2018-03-28
  Administered 2018-03-28: 75 mL via INTRAVENOUS
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
## 2018-03-29 ENCOUNTER — Other Ambulatory Visit: Payer: Self-pay | Admitting: *Deleted

## 2018-03-29 ENCOUNTER — Telehealth: Payer: Self-pay | Admitting: *Deleted

## 2018-03-29 DIAGNOSIS — I7101 Dissection of thoracic aorta: Secondary | ICD-10-CM

## 2018-03-29 DIAGNOSIS — I71019 Dissection of thoracic aorta, unspecified: Secondary | ICD-10-CM

## 2018-03-29 DIAGNOSIS — I7103 Dissection of thoracoabdominal aorta: Secondary | ICD-10-CM

## 2018-03-29 NOTE — Telephone Encounter (Signed)
After further review of this patient's recent CTAs involving Dr. Scot Dock, Dr. Trula Slade and 2 radiologists, it was decided to follow up in 3 months with repeat CTA Chest,Abdomen, Pelvis. I called Chad Johnson and discussed the treatment plan changes to him. He voiced understanding and agreement.

## 2018-03-30 ENCOUNTER — Telehealth: Payer: Self-pay

## 2018-03-30 ENCOUNTER — Other Ambulatory Visit: Payer: Self-pay

## 2018-03-30 NOTE — Telephone Encounter (Signed)
Attempted to contact patient. No answer/VM line not set up. Chad Johnson's response was appropriate and I believe he is clear on what to take. He is currently on Lopressor 50 mg BID with good BP control.

## 2018-03-30 NOTE — Telephone Encounter (Signed)
The patient called Chad Johnson's direct line because he wanted clarification on his dose of metoprolol, since Lurena Joiner is currently out of the office for most of the day I offered my assistance. He had been getting the 100mg  tabs and taking 1/2 tab (50mg )  BID and on his MyChart he noticed it said he was taking one tablet bid, I informed him that an updated RX was sent to his pharmacy for a tablet that is only 50mg  so he would be taking one whole tablet bid once he starts using that RX. I asked him which one he had at home and he didn't respond, I could hear him speaking to his wife in the background, then he told me he had it figured out and hung up. My impression is that he never picked up the new RX from his pharmacy, possibly because it wasn't due yet, and this caused the confusion. We may want to check in with him later to make sure he is ok.

## 2018-04-11 ENCOUNTER — Encounter: Payer: Self-pay | Admitting: Family Medicine

## 2018-04-11 ENCOUNTER — Ambulatory Visit: Payer: Self-pay | Attending: Family Medicine | Admitting: Family Medicine

## 2018-04-11 VITALS — BP 108/69 | HR 64 | Temp 97.9°F | Ht 74.0 in | Wt 179.0 lb

## 2018-04-11 DIAGNOSIS — I10 Essential (primary) hypertension: Secondary | ICD-10-CM | POA: Insufficient documentation

## 2018-04-11 DIAGNOSIS — Z79891 Long term (current) use of opiate analgesic: Secondary | ICD-10-CM | POA: Insufficient documentation

## 2018-04-11 DIAGNOSIS — Z79899 Other long term (current) drug therapy: Secondary | ICD-10-CM | POA: Insufficient documentation

## 2018-04-11 DIAGNOSIS — Z7982 Long term (current) use of aspirin: Secondary | ICD-10-CM | POA: Insufficient documentation

## 2018-04-11 DIAGNOSIS — I7103 Dissection of thoracoabdominal aorta: Secondary | ICD-10-CM | POA: Insufficient documentation

## 2018-04-11 DIAGNOSIS — N289 Disorder of kidney and ureter, unspecified: Secondary | ICD-10-CM | POA: Insufficient documentation

## 2018-04-11 NOTE — Patient Instructions (Signed)
Aortic Dissection An aortic dissection happens when there is a tear in the main blood vessel of the body (aorta). The aorta comes out of the heart, curves around, and then goes down the chest (thoracic aorta) and into the abdomen (abdominal aorta) to supply arteries with blood. The wall of the aorta has inner and outer layers. Aortic dissection occurs most often in the thoracic aorta. As the tear widens and blood flows through it, the aorta becomes "double-barreled." This means that one part of the aorta continues to carry blood to the body, but blood also flows into the tear, between the layers of the aorta. The torn part of the aorta fills with blood and swells up. This can reduce blood flow through the part of the aorta that is still supplying blood to the body. Aortic dissection is a medical emergency. What are the causes? An aortic dissection is commonly caused by weakening of the artery wall due to high blood pressure. Other causes may include:  An injury, such as from a car crash.  Birth defects that affect the heart (congenital heart defects).  Thickening of the artery walls.  In some cases, the cause is not known. What increases the risk? The following factors may make you more likely to develop this condition:  Having certain medical conditions, such as: ? High blood pressure (hypertension). ? Hardening and narrowing of the arteries (atherosclerosis). ? A genetic disorder that affects the connective tissue, such as Marfan syndrome or Ehlers-Danlos syndrome. ? A condition that causes inflammation of blood vessels, such as giant cell arteritis.  Having a chest injury.  Having surgery on the aorta.  Being born with a congenital heart defect.  Being male.  Being older than age 68.  Using cocaine.  Smoking.  Lifting heavy weights or doing other types of high-intensity resistance training.  What are the signs or symptoms? Signs and symptoms of aortic dissection start  suddenly. The most common symptoms are:  Severe chest pain that may feel like tearing, stabbing, or sharp pain.  Severe pain that spreads (radiates) to the back, neck, jaw, or abdomen.  Other symptoms may include:  Trouble breathing.  Dizziness or fainting.  Sudden weakness on one side of the body.  Nausea or vomiting.  Trouble swallowing.  Coughing up blood.  Vomiting blood.  Clammy skin.  How is this diagnosed? This condition may be diagnosed based on:  Your symptoms.  A physical exam. This may include: ? Listening for abnormal blood flow sounds (murmurs) in your chest or abdomen. ? Checking your pulse in your arms and legs. ? Checking your blood pressure to see whether it is low or whether there is a difference between the measurements in your right and left arm.  Electrocardiogram (ECG). This test measures the electrical activity in your heart.  Chest X-ray.  CT scan.  MRI.  Aortic angiogram. This test involves injecting dye to make it easier to see your blood vessels clearly.  Echocardiogram to study your heart using sound waves.  Blood tests.  How is this treated? It is important to treat an aortic dissection as quickly as possible. Treatment may start as soon as your health care provider thinks that you have aortic dissection. Treatment depends on the location and severity of your dissection and your overall health. Treatment may include:  Medicines to lower your blood pressure.  Surgery to repair the dissected part of your aorta with artificial material (syntheticgraft).  A medical procedure to insert a stent-graft into  the aorta (endovascular procedure). During this procedure, a long, thin tube (stent) is inserted into an artery near the groin (femoral artery) and moved up to the damaged part of the aorta. Then, the stent is opened to help improve blood flow and prevent future dissection.  Follow these instructions at home: Activity  Avoid  activities that could injure your chest or your abdomen. Ask your health care provider what activities are safe for you.  After you have recovered, try to stay active. Ask your health care provider what activities are safe for you after recovery.  Do not lift anything that is heavier than 10 lb (4.5 kg) until your health care provider approves.  Do not drive or use heavy machinery while taking prescription pain medicine. Eating and drinking  Eat a heart-healthy diet, which includes lots of fresh fruits and vegetables, low-fat (lean) protein, and whole grains.  Check ingredients and nutrition facts on packaged foods and beverages, and avoid foods with high amounts of: ? Salt (sodium). ? Saturated fats (like red meat). ? Trans fats (like fried food). General instructions  Take over-the-counter and prescription medicines only as told by your health care provider.  Work with your health care provider to manage your blood pressure.  Talk with your health care provider about how to manage stress.  Do not use any products that contain nicotine or tobacco, such as cigarettes and e-cigarettes. If you need help quitting, ask your health care provider.  Keep all follow-up visits as told by your health care provider. This is important. Get help right away if:  You develop any symptoms of aortic dissection after treatment, including severe pain in your chest, back, or abdomen.  You have a pain in your abdomen.  You have trouble breathing or you develop a cough.  You faint.  You develop a racing heartbeat. These symptoms may represent a serious problem that is an emergency. Do not wait to see if the symptoms will go away. Get medical help right away. Call your local emergency services (911 in the [place] Do not drive yourself to the hospital. Summary  An aortic dissection happens when there is a tear in the main blood vessel of the body (aorta). It is a medical emergency.  The most common  symptom is severe pain in the chest that spreads (radiates) to the back, neck, jaw, or abdomen.  It is important to treat an aortic dissection as quickly as possible. Treatment typically includes surgery and medicines. This information is not intended to replace advice given to you by your health care provider. Make sure you discuss any questions you have with your health care provider. Document Released: 10/18/2007 Document Revised: 05/30/2016 Document Reviewed: 05/30/2016 Elsevier Interactive Patient Education  2017 [employer].
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
## 2018-04-20 ENCOUNTER — Ambulatory Visit (HOSPITAL_COMMUNITY): Payer: Self-pay

## 2018-04-24 ENCOUNTER — Ambulatory Visit (HOSPITAL_COMMUNITY)
Admission: RE | Admit: 2018-04-24 | Discharge: 2018-04-24 | Disposition: A | Discharge status: Home / Self Care | Payer: Self-pay | Source: Ambulatory Visit | Attending: Family Medicine | Admitting: Family Medicine

## 2018-04-24 DIAGNOSIS — I7102 Dissection of abdominal aorta: Secondary | ICD-10-CM | POA: Insufficient documentation

## 2018-04-24 DIAGNOSIS — N289 Disorder of kidney and ureter, unspecified: Secondary | ICD-10-CM | POA: Insufficient documentation

## 2018-04-24 LAB — CREATININE, SERUM
Creatinine, Ser: 0.74 mg/dL (ref 0.61–1.24)
GFR calc non Af Amer: >60 mL/min (ref >60)

## 2018-04-24 MED ORDER — GADOBUTROL 1 MMOL/ML IV SOLN
8.0000 mL | Freq: Once | INTRAVENOUS | Status: AC | PRN
  Administered 2018-04-24: 8 mL via INTRAVENOUS

## 2018-05-25 ENCOUNTER — Telehealth: Payer: Self-pay | Admitting: Vascular Surgery

## 2018-05-25 NOTE — Telephone Encounter (Signed)
-----   Message from Penni Homans, RN sent at 03/29/2018  9:58 AM EDT ----- Regarding: FW: CT & f/u appointment   ----- Message ----- From: Angelia Mould, MD Sent: 03/28/2018   5:50 PM EDT To: [facility] Charge Pool Subject: CT                                             I saw this patient earlier today.  The CT scan still had not been read so I called radiology and reviewed the films with Dr. Lenox Ahr.  After reviewing the final images I think it would be safer for his follow-up CT scan to be in 6 months instead of one year.  This is a CT angiogram of the chest abdomen and pelvis.  In addition, I think he should follow-up with Dr. Trula Slade and I will discuss this with him. Thanks CD

## 2018-05-25 NOTE — Telephone Encounter (Signed)
sch appt spk to pt mld ltr 06/18/18 12pm CTA chest/abd/pelvis 06/25/18 215 f/u MD

## 2018-05-30 ENCOUNTER — Other Ambulatory Visit: Payer: Self-pay | Admitting: Family Medicine

## 2018-05-30 DIAGNOSIS — I10 Essential (primary) hypertension: Secondary | ICD-10-CM

## 2018-06-18 ENCOUNTER — Ambulatory Visit
Admission: RE | Admit: 2018-06-18 | Discharge: 2018-06-18 | Disposition: A | Discharge status: Home / Self Care | Payer: Self-pay | Source: Ambulatory Visit | Attending: Vascular Surgery | Admitting: Vascular Surgery

## 2018-06-18 DIAGNOSIS — I7103 Dissection of thoracoabdominal aorta: Secondary | ICD-10-CM

## 2018-06-18 MED ORDER — IOPAMIDOL (ISOVUE-370) INJECTION 76%
75.0000 mL | Freq: Once | INTRAVENOUS | Status: AC | PRN
  Administered 2018-06-18: 75 mL via INTRAVENOUS

## 2018-06-19 ENCOUNTER — Telehealth: Payer: Self-pay

## 2018-06-19 NOTE — Telephone Encounter (Signed)
Spoke with patient who is requesting to get his results from CT scan. Explained to him he will get them at his appt on 12/9. Pt. Verbalized understanding.

## 2018-06-25 ENCOUNTER — Ambulatory Visit: Payer: Self-pay | Admitting: Surgery

## 2018-07-01 ENCOUNTER — Other Ambulatory Visit: Payer: Self-pay | Admitting: Family Medicine

## 2018-07-01 DIAGNOSIS — I10 Essential (primary) hypertension: Secondary | ICD-10-CM

## 2018-07-02 ENCOUNTER — Other Ambulatory Visit: Payer: Self-pay

## 2018-07-02 ENCOUNTER — Encounter: Payer: Self-pay | Admitting: Surgery

## 2018-07-02 ENCOUNTER — Other Ambulatory Visit: Payer: Self-pay | Admitting: *Deleted

## 2018-07-02 ENCOUNTER — Ambulatory Visit (INDEPENDENT_AMBULATORY_CARE_PROVIDER_SITE_OTHER): Payer: Self-pay | Admitting: Surgery

## 2018-07-02 VITALS — BP 124/72 | HR 60 | Temp 97.9°F | Resp 16 | Ht 74.0 in | Wt 187.0 lb

## 2018-07-02 DIAGNOSIS — I7103 Dissection of thoracoabdominal aorta: Secondary | ICD-10-CM
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
## 2018-07-10 ENCOUNTER — Ambulatory Visit: Payer: Self-pay | Admitting: Family Medicine

## 2018-08-06 ENCOUNTER — Ambulatory Visit (HOSPITAL_BASED_OUTPATIENT_CLINIC_OR_DEPARTMENT_OTHER): Payer: Self-pay | Admitting: Licensed Clinical Social Worker

## 2018-08-06 ENCOUNTER — Encounter: Payer: Self-pay | Admitting: Family Medicine

## 2018-08-06 ENCOUNTER — Ambulatory Visit: Payer: Self-pay | Attending: Family Medicine | Admitting: Family Medicine

## 2018-08-06 VITALS — BP 116/75 | HR 56 | Temp 98.1°F | Ht 74.0 in | Wt 189.6 lb

## 2018-08-06 DIAGNOSIS — I10 Essential (primary) hypertension: Secondary | ICD-10-CM | POA: Insufficient documentation

## 2018-08-06 DIAGNOSIS — F0631 Mood disorder due to known physiological condition with depressive features: Secondary | ICD-10-CM

## 2018-08-06 DIAGNOSIS — F419 Anxiety disorder, unspecified: Secondary | ICD-10-CM

## 2018-08-06 DIAGNOSIS — F329 Major depressive disorder, single episode, unspecified: Secondary | ICD-10-CM | POA: Insufficient documentation

## 2018-08-06 DIAGNOSIS — Z7982 Long term (current) use of aspirin: Secondary | ICD-10-CM | POA: Insufficient documentation

## 2018-08-06 DIAGNOSIS — I719 Aortic aneurysm of unspecified site, without rupture: Secondary | ICD-10-CM | POA: Insufficient documentation

## 2018-08-06 DIAGNOSIS — Z87891 Personal history of nicotine dependence: Secondary | ICD-10-CM | POA: Insufficient documentation

## 2018-08-06 DIAGNOSIS — Z915 Personal history of self-harm: Secondary | ICD-10-CM | POA: Insufficient documentation

## 2018-08-06 DIAGNOSIS — F331 Major depressive disorder, recurrent, moderate: Secondary | ICD-10-CM

## 2018-08-06 DIAGNOSIS — I7103 Dissection of thoracoabdominal aorta: Secondary | ICD-10-CM | POA: Insufficient documentation

## 2018-08-06 DIAGNOSIS — Z79899 Other long term (current) drug therapy: Secondary | ICD-10-CM | POA: Insufficient documentation

## 2018-08-06 MED ORDER — HYDRALAZINE HCL 50 MG PO TABS: 50.0000 mg | ORAL_TABLET | Freq: Three times a day (TID) | ORAL | 6 refills | Status: DC

## 2018-08-06 MED ORDER — METOPROLOL TARTRATE 50 MG PO TABS: 50.0000 mg | ORAL_TABLET | Freq: Two times a day (BID) | ORAL | 6 refills | Status: DC
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
## 2018-08-07 LAB — CMP14+EGFR
ALT: 15 IU/L (ref 0–44)
AST: 19 IU/L (ref 0–40)
Albumin/Globulin Ratio: 2.2 (ref 1.2–2.2)
Albumin: 4.9 g/dL (ref 3.5–5.5)
Alkaline Phosphatase: 65 IU/L (ref 39–117)
BILIRUBIN TOTAL: 0.5 mg/dL (ref 0.0–1.2)
BUN/Creatinine Ratio: 11 (ref 9–20)
BUN: 9 mg/dL (ref 6–20)
CHLORIDE: 103 mmol/L (ref 96–106)
CO2: 23 mmol/L (ref 20–29)
Calcium: 9.5 mg/dL (ref 8.7–10.2)
Creatinine, Ser: 0.84 mg/dL (ref 0.76–1.27)
GFR calc non Af Amer: 111 mL/min/{1.73_m2} (ref >59)
GFR, EST AFRICAN AMERICAN: 128 mL/min/{1.73_m2} (ref >59)
Globulin, Total: 2.2 g/dL (ref 1.5–4.5)
Glucose: 57 mg/dL — ABNORMAL LOW (ref 65–99)
Potassium: 4.3 mmol/L (ref 3.5–5.2)
Sodium: 145 mmol/L — ABNORMAL HIGH (ref 134–144)
TOTAL PROTEIN: 7.1 g/dL (ref 6.0–8.5)

## 2018-08-07 LAB — T4, FREE: Free T4: 1.09 ng/dL (ref 0.82–1.77)

## 2018-08-07 LAB — TSH: TSH: 2.84 u[IU]/mL (ref 0.450–4.500)

## 2018-08-08 NOTE — BH Specialist Note (Signed)
Integrated Behavioral Health Initial Visit  MRN: [id number] Name: Chad Johnson  Number of [place] Clinician visits:: 1/6 Session Start time: 3:10 PM  Session End time: 3:35 PM Total time: 25 minutes  Type of Service: [place] Interpretor:No. Interpretor Name and Language: N/A   Warm Hand Off Completed.       SUBJECTIVE: Chad Johnson is a 38 y.o. male accompanied by Partner Patient was referred by Dr. Margarita Rana for depression and anxiety. Patient reports the following symptoms/concerns: Pt reports difficulty managing mental health triggered by recent change in physical health Duration of problem: June 2019; Severity of problem: moderate  OBJECTIVE: Mood: Anxious and Depressed and Affect: Appropriate Risk of harm to self or others: No plan to harm self or others  LIFE CONTEXT: Family and Social: Pt receives strong support from partner. 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  Life Changes: Pt reports difficulty managing mental health triggered by recent change in physical health  GOALS ADDRESSED: Patient will: 1. Reduce symptoms of: agitation, anxiety and depression 2. Increase knowledge and/or ability of: coping skills, healthy habits and self-management skills  3. Demonstrate ability to: Increase healthy adjustment to current life circumstances, Increase adequate support systems for patient/family and Decrease self-medicating behaviors  INTERVENTIONS: Interventions utilized: Mindfulness or Psychologist, educational, Supportive Counseling and Psychoeducation and/or Health Education  Standardized Assessments completed: GAD-7 and PHQ 2&9  ASSESSMENT: Patient currently experiencing depression and anxiety. Pt reports difficulty managing mental health triggered by recent  change in physical health that requires medication management. Pt was accompanied by spouse who is a strong support system. According to family, pt has endorsed SI with no plan. Protective factors were identified and safety plan was discussed. Crisis intervention resources were provided.   Patient may benefit from psychoeducation, psychotherapy, and medication management. LCSWA educated pt on the correlation between one's physical and mental health. Therapeutic interventions were discussed to identify warning signs to anger, in addition, to healthy coping skills. Pt is not interested in initiating psychotropic medication. Peer support resources were provided.   PLAN: 1. Follow up with behavioral health clinician on : Pt was encouraged to contact LCSWA if symptoms worsen or fail to improve to schedule behavioral appointments at [facility]. 2. Behavioral recommendations: LCSWA recommends that pt apply healthy coping skills discussed and utilize provided resources. Pt is encouraged to schedule follow up appointment with LCSWA 3. Referral(s): [place] (In Clinic) and [place] (LME/Outside Clinic) 4. "From scale of 1-10, how likely are you to follow plan?":   [name], LCSW 08/10/2018 10:13 AM

## 2018-09-24 ENCOUNTER — Other Ambulatory Visit: Payer: Self-pay

## 2018-09-24 DIAGNOSIS — I7101 Dissection of thoracic aorta: Secondary | ICD-10-CM

## 2018-09-24 DIAGNOSIS — I71019 Dissection of thoracic aorta, unspecified: Secondary | ICD-10-CM

## 2018-09-24 DIAGNOSIS — I7103 Dissection of thoracoabdominal aorta: Secondary | ICD-10-CM

## 2019-03-25 ENCOUNTER — Other Ambulatory Visit: Payer: Self-pay | Admitting: Surgery

## 2019-03-25 DIAGNOSIS — I71019 Dissection of thoracic aorta, unspecified: Secondary | ICD-10-CM

## 2019-03-25 DIAGNOSIS — I7103 Dissection of thoracoabdominal aorta: Secondary | ICD-10-CM

## 2019-03-25 DIAGNOSIS — I7101 Dissection of thoracic aorta: Secondary | ICD-10-CM

## 2019-04-07 ENCOUNTER — Other Ambulatory Visit: Payer: Self-pay | Admitting: Family Medicine

## 2019-04-07 DIAGNOSIS — I10 Essential (primary) hypertension: Secondary | ICD-10-CM

## 2019-04-15 ENCOUNTER — Ambulatory Visit
Admission: RE | Admit: 2019-04-15 | Discharge: 2019-04-15 | Disposition: A | Discharge status: Home / Self Care | Payer: Self-pay | Source: Ambulatory Visit | Attending: Surgery | Admitting: Surgery

## 2019-04-15 ENCOUNTER — Other Ambulatory Visit: Payer: Self-pay

## 2019-04-15 DIAGNOSIS — I7101 Dissection of thoracic aorta: Secondary | ICD-10-CM

## 2019-04-15 DIAGNOSIS — I7103 Dissection of thoracoabdominal aorta: Secondary | ICD-10-CM

## 2019-04-15 DIAGNOSIS — I71019 Dissection of thoracic aorta, unspecified: Secondary | ICD-10-CM

## 2019-04-15 MED ORDER — IOPAMIDOL (ISOVUE-370) INJECTION 76%
75.0000 mL | Freq: Once | INTRAVENOUS | Status: AC | PRN
  Administered 2019-04-15: 75 mL via INTRAVENOUS

## 2019-04-22 ENCOUNTER — Ambulatory Visit (INDEPENDENT_AMBULATORY_CARE_PROVIDER_SITE_OTHER): Payer: Self-pay | Admitting: Surgery

## 2019-04-22 ENCOUNTER — Other Ambulatory Visit: Payer: Self-pay

## 2019-04-22 ENCOUNTER — Encounter: Payer: Self-pay | Admitting: Surgery

## 2019-04-22 DIAGNOSIS — I7103 Dissection of thoracoabdominal aorta: Secondary | ICD-10-CM
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
## 2019-05-07 ENCOUNTER — Other Ambulatory Visit: Payer: Self-pay | Admitting: Family Medicine

## 2019-05-07 DIAGNOSIS — I10 Essential (primary) hypertension: Secondary | ICD-10-CM

## 2019-06-06 ENCOUNTER — Other Ambulatory Visit: Payer: Self-pay | Admitting: Family Medicine

## 2019-06-06 DIAGNOSIS — I10 Essential (primary) hypertension: Secondary | ICD-10-CM

## 2019-07-07 ENCOUNTER — Other Ambulatory Visit: Payer: Self-pay | Admitting: Family Medicine

## 2019-07-07 DIAGNOSIS — I10 Essential (primary) hypertension: Secondary | ICD-10-CM

## 2019-07-16 ENCOUNTER — Telehealth (HOSPITAL_BASED_OUTPATIENT_CLINIC_OR_DEPARTMENT_OTHER): Payer: No Typology Code available for payment source | Admitting: Family Medicine

## 2019-07-16 ENCOUNTER — Encounter: Payer: Self-pay | Admitting: Family Medicine

## 2019-07-16 DIAGNOSIS — I7103 Dissection of thoracoabdominal aorta: Secondary | ICD-10-CM

## 2019-07-16 DIAGNOSIS — I10 Essential (primary) hypertension: Secondary | ICD-10-CM

## 2019-07-16 MED ORDER — METOPROLOL TARTRATE 50 MG PO TABS: ORAL_TABLET | ORAL | 1 refills | Status: DC

## 2019-07-16 MED ORDER — HYDRALAZINE HCL 50 MG PO TABS: 50.0000 mg | ORAL_TABLET | Freq: Two times a day (BID) | ORAL | 1 refills | Status: DC
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
## 2019-08-08 ENCOUNTER — Ambulatory Visit: Payer: Self-pay | Admitting: Genetic Counselor

## 2019-08-08 ENCOUNTER — Other Ambulatory Visit: Payer: Self-pay

## 2019-08-19 NOTE — Progress Notes (Signed)
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  Pre-Test Genetic Consultation Notes  Shy was counseled on the genetics of syndromes that present with aortic aneurysms and dissections, specifically Marfan syndrome (MFS), Loeys-Dietz syndrome (LDS), Vascular Ehlers-Danlos Syndrome (vEDS) and other non-syndromic familial forms of thoracic aortic aneurysms and dissection (FTAAD). We discussed inheritance, incomplete penetrance and variable expression associated with these conditions.   We walked through the process of genetic testing and discussed the potential outcomes of genetic testing. I explained to him that there are three possible outcomes of genetic testing; namely positive, negative and variant of unknown significance. A positive outcome can be expected in patients that do not have risk factors for aortic disease, present early in life with increased severity and have a family history of sudden cardiac death and/or a relative that has been diagnosed with an aortopathy. A negative test does not exclude a genetic basis for HCM. Limitations in current genetic testing methodology can produce a negative result. Variants of unknown significance (VUS) can be obtained. I explained to him that typically a VUS is so classified if the variant is not well understood as very few individuals have been reported to harbor this variant or its role in gene function has not been elucidated. I then reviewed genetic testing of first-degree family members and subsequent follow-up of those at risk of inheriting this condition.   His medical and 4-generation family history was obtained. See details below-  [place] (III.6 on pedigree) is a 39 year old pleasant Caucasian man. He reports sudden onset of a sharp, searing pain in June 2019. As the  pain quickly subsided, he did not pursue medical care. The next day, while having a haircut, he experienced the same pain but much more intense, causing him to faint. He was taken to the ER and was admitted the next day at the hospital. He reports being in the ICU for a week and was told of having an abdominal aortic dissection.  Traditional Risk Factors Rassan was diagnosed with HTN and reports smoking 2 packs/day for the 10-15 years. He also tells me of having increased stress in his life- mostly emotional stressors. He denies having atherosclerosis, or a prior aortic dissection.   Family history Arianna (III.6) is the only biological child of his parents. He has two daughters, ages 19 and 61 mos (IV.2, IV.94)- and a 82 year-old son (IV.1). Both his parents (II.3, II.4) are healthy with no overt heart disease. He is unaware of the medical status f his maternal and paternal relatives. He informs me of his paternal grandfather (I.1), age 41 who was told of having a tear in his aorta. He had atherosclerotic heart disease in the past and reportedly had a wild lifestyle in his 65s.  Impression  In summary, Shuhei presents with an abdominal aortic dissection at age 53 in the presence of risk factors, but does not have a significant family history of aortopathies amongst his first-degree relatives. Since there is a high incidence of de novo mutations in the genes associated with aortopathies, it unclear if he has a genetic condition that is further confounded by his underlying risk factors.   Genetic testing for the genes implicated in connective tissue disorders is recommended. This test should include the major genes that contribute to MFS, LDS, vEDS and FTAAD. The genetic test will help confirm his diagnosis  and identify the genetic basis of his disease. Since this is an autosomal dominant condition,  a positive test result will help identify first-degree family members (children, parents) that may harbor  the mutation and are at risk of developing this condition. Appropriate cardiology follow-up and lifestyle management can then be directed to those genotype-positive family members.   In addition, we discussed the protections afforded by the Genetic Information Non-Discrimination Act (GINA). I explained to him that GINA protects him from losing employment or health insurance based on his genotype. However, these protections do not cover life insurance and disability. He verbalized understanding of this.  Please note that the patient has not undergone genetic counseling today to cope emotionally with having a likely genetic heart condition.   Plan Jhoel is anxious about pursuing genetic testing. He would like to know if his insurance will cover genetic testing. If they do not cover the test, he may consider a self-pay option and come in later for blood draw. The lab has reached out to him a couple of times to obtain a copy of his insurance card but he has not responded to the calls.     Lattie Corns, Ph.D, [facility] Clinical Molecular Geneticist

## 2019-08-31 IMAGING — CT CT ABD-PELV W/ CM
2 of 4 series · 16 of 46 positions shown, 18 images · IV contrast (APPLIED)
Comparison: CT 04/18/2015

CLINICAL DATA: Left lower quadrant pain

EXAM:
CT ABDOMEN AND PELVIS WITH CONTRAST
TECHNIQUE: Multidetector CT imaging of the abdomen and pelvis was performed
using the standard protocol following bolus administration of
intravenous contrast.
CONTRAST:  100mL [id number] IOPAMIDOL ([id number]) INJECTION 61%,
<See Chart> [id number] IOPAMIDOL ([id number]) INJECTION 61%

[Series 2: axial st · axial · 0.65mm/px · z∈[-563,-113]mm · 13 of 98 slices shown, 15 images]
[im 4/98  soft-tissue]
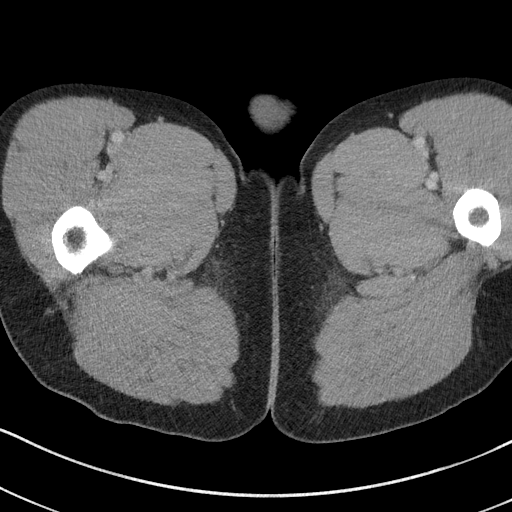
[im 4/98  bone]
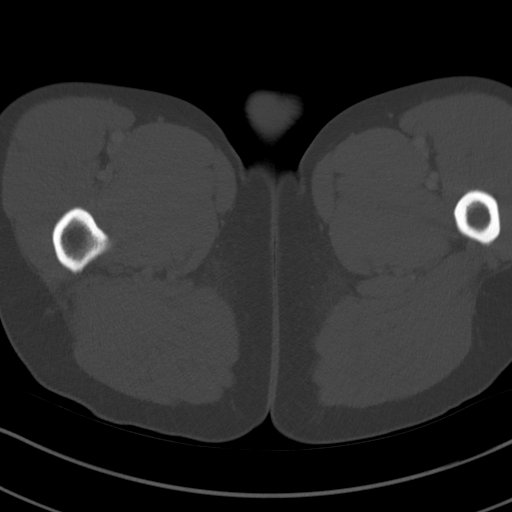
[im 12/98  soft-tissue]
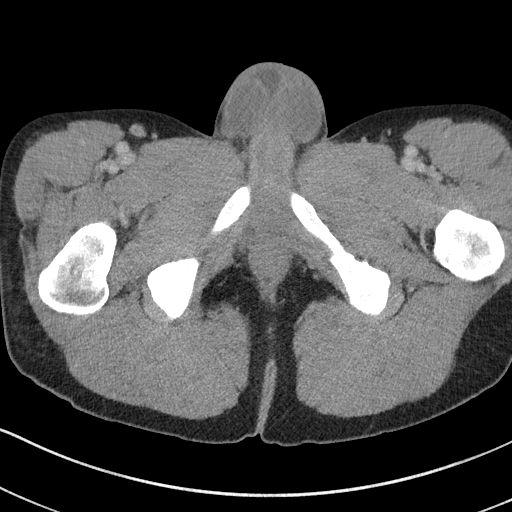
[im 20/98  soft-tissue]
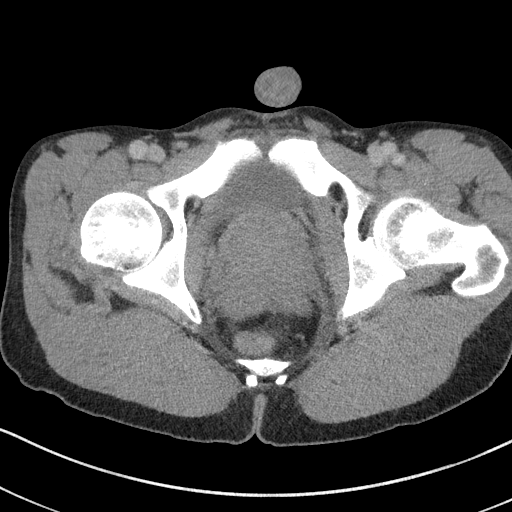
[im 28/98  soft-tissue]
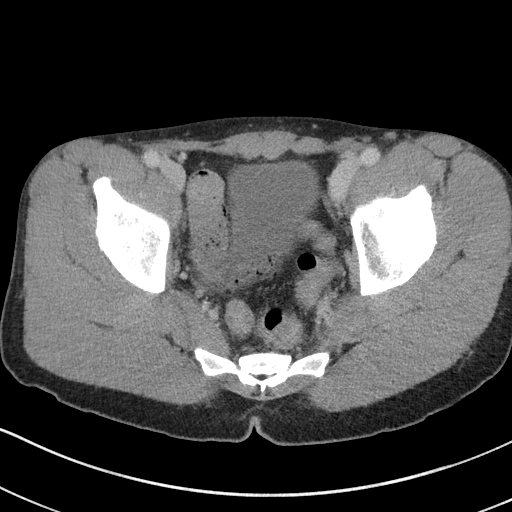
[im 35/98  soft-tissue]
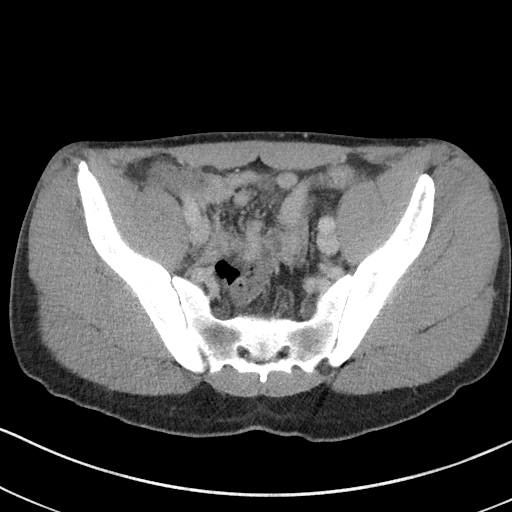
[im 43/98  soft-tissue]
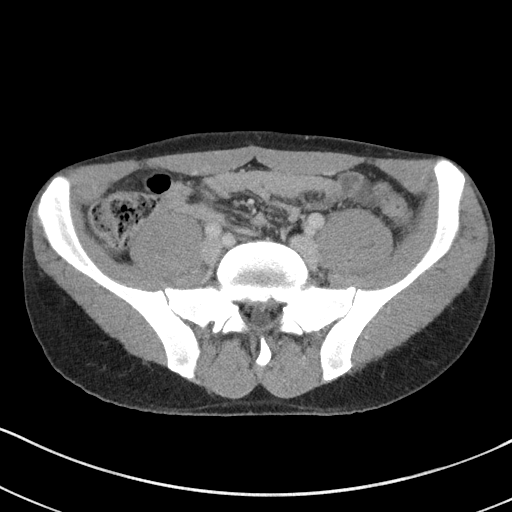
[im 51/98  soft-tissue]
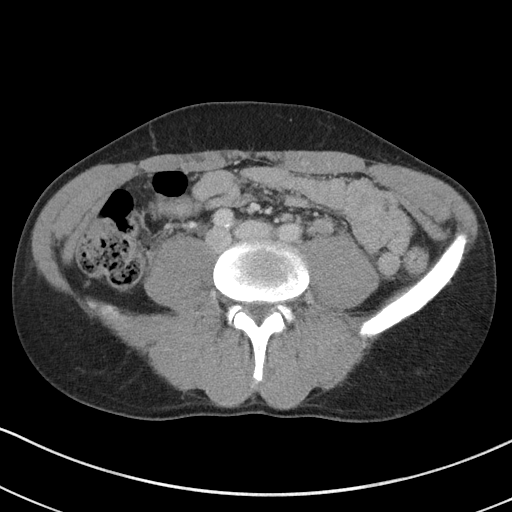
[im 55/98  soft-tissue]
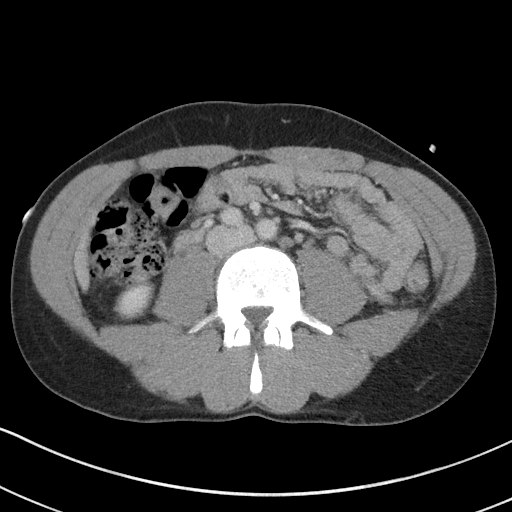
[im 63/98  soft-tissue]
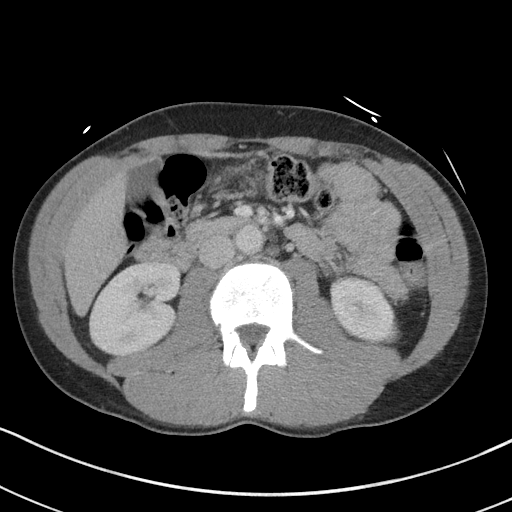
[im 63/98  bone]
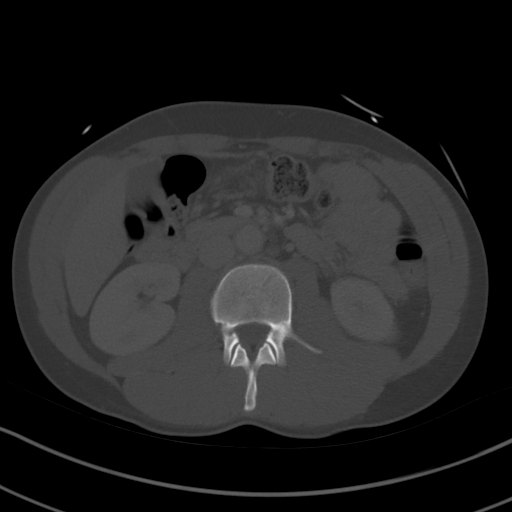
[im 70/98  soft-tissue]
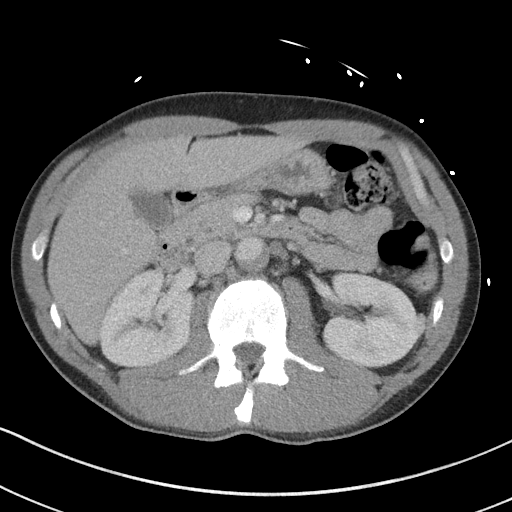
[im 78/98  soft-tissue]
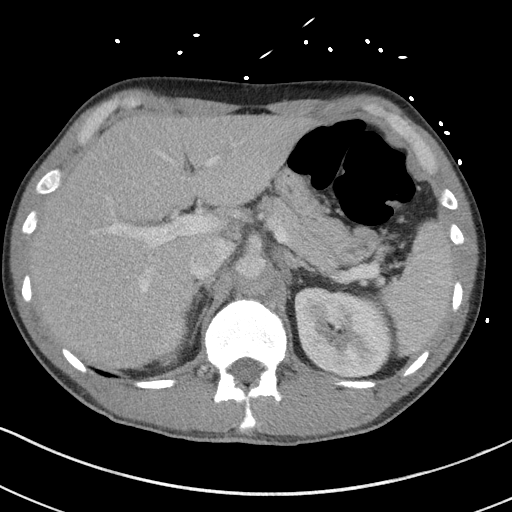
[im 86/98  soft-tissue]
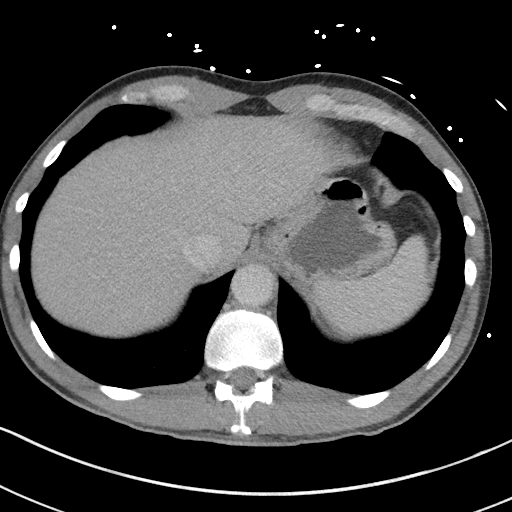
[im 94/98  soft-tissue]
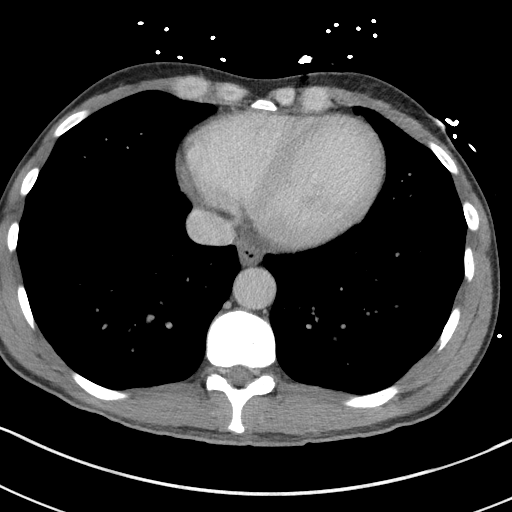

[Series 6: coronal st · coronal · 0.74mm/px · 3 of 101 slices shown]
[im 34/101  soft-tissue]
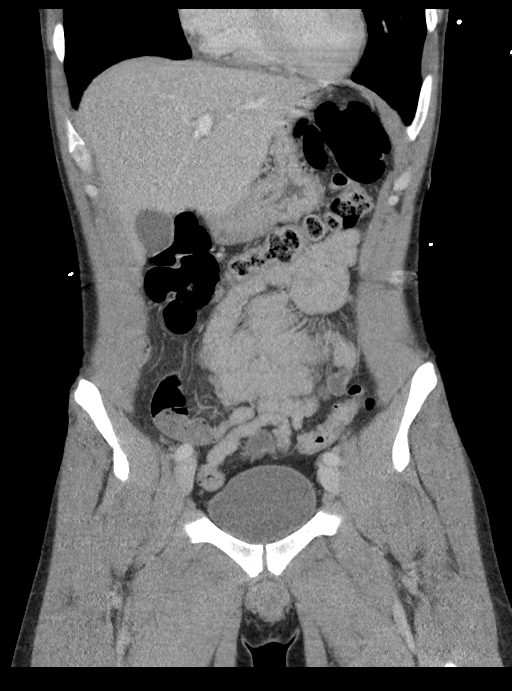
[im 45/101  soft-tissue]
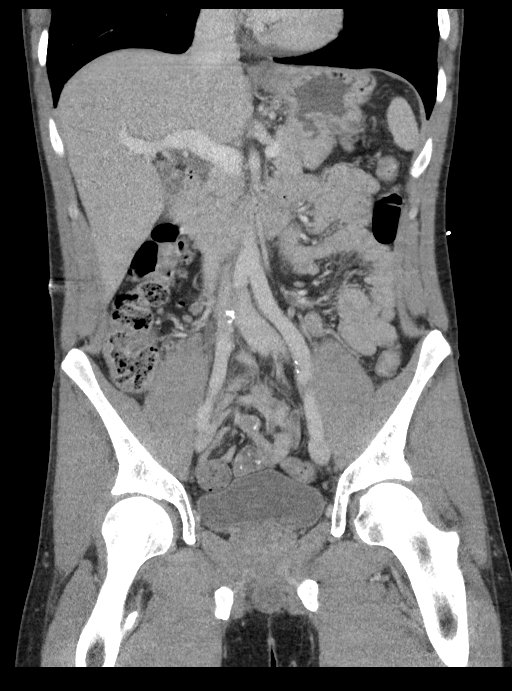
[im 56/101  soft-tissue]
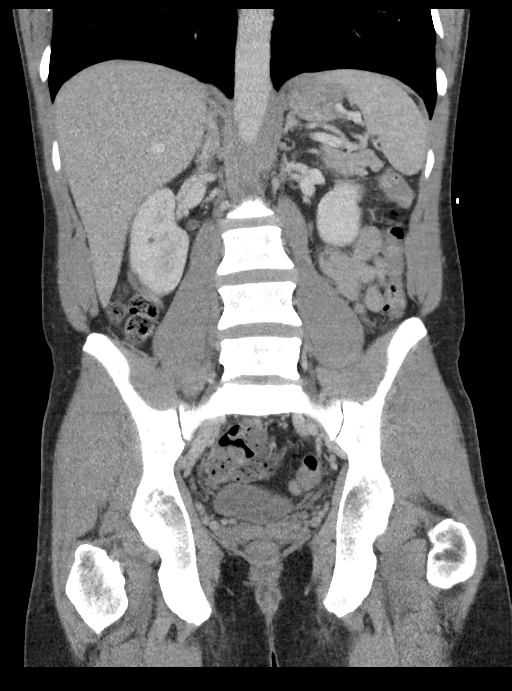

[16 of 46 positions shown; findings below may reference images not displayed]

FINDINGS: Lower chest: Lung bases are clear. No effusions. Heart is normal
size.

Hepatobiliary: No focal hepatic abnormality. Gallbladder
unremarkable.

Pancreas: No focal abnormality or ductal dilatation.

Spleen: No focal abnormality.  Normal size.

Adrenals/Urinary Tract: No adrenal abnormality. Small exophytic
lesion off the midpole of the left kidney measures 13 mm compared
with 7 mm previously. No hydronephrosis. Adrenal glands and urinary
bladder are unremarkable.

Stomach/Bowel: Stomach, large and small bowel grossly unremarkable.

Vascular/Lymphatic: Moderate atherosclerotic noncalcified plaque in
the proximal abdominal aorta causes mild luminal narrowing.
Alternatively, this conceivably could represent thrombosed focal
dissection. This is new since 8746. Mildly prominent left periaortic
lymph nodes, none pathologically enlarged. Index node has a short
axis diameter of 8 mm and is stable.

Reproductive: No visible focal abnormality.

Other: No free fluid or free air.

Musculoskeletal: No acute bony abnormality.
IMPRESSION: Eccentric noncalcified plaque versus thrombosed focal dissection
posteriorly in the proximal abdominal aorta with mild luminal
narrowing seen in the region of the SMA and renal artery origins.
These vessels are widely patent. This is new since contrast study
from 8746.

Exophytic 13 mm lesion off the midpole of the left kidney does not
appear cystic. This could be further evaluated with MRI to exclude
enhancing solid renal lesion.

Stable small shotty retroperitoneal lymph nodes.

## 2019-09-26 ENCOUNTER — Ambulatory Visit: Payer: Self-pay | Attending: Family Medicine | Admitting: Family Medicine

## 2019-09-26 ENCOUNTER — Other Ambulatory Visit: Payer: Self-pay

## 2019-09-26 DIAGNOSIS — I7103 Dissection of thoracoabdominal aorta: Secondary | ICD-10-CM

## 2019-09-26 DIAGNOSIS — I10 Essential (primary) hypertension: Secondary | ICD-10-CM

## 2019-09-26 MED ORDER — HYDRALAZINE HCL 50 MG PO TABS: 50.0000 mg | ORAL_TABLET | Freq: Two times a day (BID) | ORAL | 6 refills | Status: DC

## 2019-09-26 MED ORDER — METOPROLOL TARTRATE 50 MG PO TABS: ORAL_TABLET | ORAL | 6 refills | Status: DC
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
## 2019-09-26 NOTE — Progress Notes (Signed)
Patient has been called and DOB has been verified. Patient has been screened and transferred to PCP to start phone visit.     

## 2020-03-10 ENCOUNTER — Ambulatory Visit: Payer: Medicaid Other | Attending: Family Medicine | Admitting: Family Medicine

## 2020-03-10 ENCOUNTER — Other Ambulatory Visit: Payer: Self-pay

## 2020-03-10 ENCOUNTER — Encounter: Payer: Self-pay | Admitting: Family Medicine

## 2020-03-10 VITALS — BP 120/75 | HR 63 | Ht 74.0 in | Wt 183.0 lb

## 2020-03-10 DIAGNOSIS — I7103 Dissection of thoracoabdominal aorta: Secondary | ICD-10-CM

## 2020-03-10 DIAGNOSIS — L739 Follicular disorder, unspecified: Secondary | ICD-10-CM

## 2020-03-10 DIAGNOSIS — I10 Essential (primary) hypertension: Secondary | ICD-10-CM

## 2020-03-10 DIAGNOSIS — L255 Unspecified contact dermatitis due to plants, except food: Secondary | ICD-10-CM

## 2020-03-10 DIAGNOSIS — D229 Melanocytic nevi, unspecified: Secondary | ICD-10-CM

## 2020-03-10 MED ORDER — METOPROLOL TARTRATE 25 MG PO TABS: 25.0000 mg | ORAL_TABLET | Freq: Two times a day (BID) | ORAL | 6 refills | Status: DC

## 2020-03-10 MED ORDER — CEPHALEXIN 500 MG PO CAPS: 500.0000 mg | ORAL_CAPSULE | Freq: Two times a day (BID) | ORAL | 0 refills | Status: DC

## 2020-03-10 MED ORDER — HYDRALAZINE HCL 50 MG PO TABS: 50.0000 mg | ORAL_TABLET | Freq: Two times a day (BID) | ORAL | 6 refills | Status: DC

## 2020-03-10 MED ORDER — PREDNISONE 10 MG PO TABS: 10.0000 mg | ORAL_TABLET | Freq: Every day | ORAL | 0 refills | Status: DC
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
## 2020-03-10 NOTE — Progress Notes (Signed)
States that he may have come in contact with poision ivy, has rash on body.  Has a little bump on private area.

## 2020-03-10 NOTE — Patient Instructions (Signed)

## 2020-04-21 ENCOUNTER — Telehealth: Payer: Self-pay | Admitting: Family Medicine

## 2020-04-21 NOTE — Telephone Encounter (Signed)
Copied from [place] [phone]. Topic: General - Other >> Apr 15, 2020  3:32 PM Wynetta Emery, [place] C wrote: Reason for CRM: pt called in for assistance. Pt updated the office/chart with his insurance information( Medicaid) pt would like to know if office would complete his dermatology referral for scheduling.  Please assist.

## 2020-04-21 NOTE — Telephone Encounter (Signed)
Mailbox is currently full.

## 2020-04-22 ENCOUNTER — Telehealth: Payer: Self-pay | Admitting: Family Medicine

## 2020-04-22 NOTE — Telephone Encounter (Signed)
Copied from [place] [phone]. Topic: General - Other >> Apr 21, 2020  5:05 PM Yvette Rack wrote: Reason for CRM: Pt stated he had a missed call from the office. Attempted to transfer pt but was advised person who called had left for the day. Pt requests call back. Pt asked that a message is left if he does not answer.

## 2020-05-27 ENCOUNTER — Ambulatory Visit (INDEPENDENT_AMBULATORY_CARE_PROVIDER_SITE_OTHER): Payer: Self-pay | Admitting: Dermatology

## 2020-05-27 ENCOUNTER — Ambulatory Visit: Payer: No Typology Code available for payment source | Admitting: Dermatology

## 2020-05-27 ENCOUNTER — Other Ambulatory Visit: Payer: Self-pay

## 2020-05-27 DIAGNOSIS — L906 Striae atrophicae: Secondary | ICD-10-CM

## 2020-05-27 DIAGNOSIS — W540XXA Bitten by dog, initial encounter: Secondary | ICD-10-CM

## 2020-05-27 DIAGNOSIS — L578 Other skin changes due to chronic exposure to nonionizing radiation: Secondary | ICD-10-CM

## 2020-05-27 DIAGNOSIS — L720 Epidermal cyst: Secondary | ICD-10-CM

## 2020-05-27 DIAGNOSIS — Z1283 Encounter for screening for malignant neoplasm of skin: Secondary | ICD-10-CM

## 2020-05-27 DIAGNOSIS — L814 Other melanin hyperpigmentation: Secondary | ICD-10-CM

## 2020-05-27 DIAGNOSIS — D1800 Hemangioma unspecified site: Secondary | ICD-10-CM

## 2020-05-27 DIAGNOSIS — S71152A Open bite, left thigh, initial encounter: Secondary | ICD-10-CM

## 2020-05-27 DIAGNOSIS — L821 Other seborrheic keratosis: Secondary | ICD-10-CM

## 2020-05-27 DIAGNOSIS — D485 Neoplasm of uncertain behavior of skin: Secondary | ICD-10-CM

## 2020-05-27 DIAGNOSIS — D229 Melanocytic nevi, unspecified: Secondary | ICD-10-CM

## 2020-05-27 NOTE — Patient Instructions (Signed)

## 2020-05-27 NOTE — Progress Notes (Signed)
New Patient Visit  Subjective  Chad Johnson is a 39 y.o. male who presents for the following: Annual Exam. Patient has a lesion on his penis that has been there for about 2 months that he is concerned about. He was prescribed anitibiotics and Prednisone from his PCP, but didn't notice an improvement in appearance.  The patient presents for Total-Body Skin Exam (TBSE) for skin cancer screening and mole check.  The following portions of the chart were reviewed this encounter and updated as appropriate:  Tobacco  Allergies  Meds  Problems  Med Hx  Surg Hx  Fam Hx     Review of Systems:  No other skin or systemic complaints except as noted in HPI or Assessment and Plan.  Objective  Well appearing patient in no apparent distress; mood and affect are within normal limits.  A full examination was performed including scalp, head, eyes, ears, nose, lips, neck, chest, axillae, abdomen, back, buttocks, bilateral upper extremities, bilateral lower extremities, hands, feet, fingers, toes, fingernails, and toenails. All findings within normal limits unless otherwise noted below.  Objective  back: Linear silver steaks.  Objective  Left Thigh - Anterior: Laceration   Objective  Right Penile Shaft: 0.6 x 0.4 cm firm SQ nodule   Objective  L prox mid penis: 0.4 cm brown papule; other brown papules around location  Assessment & Plan  Striae (stretch marks) back Chronic. Benign, observe.  Mederma vs Serica treatment.  Dog bite, initial encounter Left Thigh - Anterior Crusted erosion.  No evidence of infection today. Continue wound care daily. Watch for infection and RTC PRN.  Cleansed and dressed.  Epidermal inclusion cyst Right Penile Shaft 0.6x0.4 cm cystic papule with open comedones Benign appearing, reassured harmless, but can be excised if bothersome. Will likely grow with time.  Will persist chronically unless excised.  Neoplasm of uncertain behavior of skin -  multiple brown papules proximal penile shaft.  HPV/condyloma vs nevi vs seborrheic keratoses. L prox mid penis  Skin / nail biopsy Type of biopsy: tangential   Informed consent: discussed and consent obtained   Timeout: patient name, date of birth, surgical site, and procedure verified   Procedure prep:  Patient was prepped and draped in usual sterile fashion Prep type:  Isopropyl alcohol Anesthesia: the lesion was anesthetized in a standard fashion   Anesthetic:  1% lidocaine w/ epinephrine 1-100,000 buffered w/ 8.4% NaHCO3 Instrument used: flexible razor blade   Hemostasis achieved with: pressure, aluminum chloride and electrodesiccation   Outcome: patient tolerated procedure well   Post-procedure details: sterile dressing applied and wound care instructions given   Dressing type: bandage and petrolatum    Specimen 1 - Surgical pathology Differential Diagnosis: D48.5 r/o ISK vs condyloma vs nevus  Check Margins: No 0.4 cm brown papule  Discussed with patient that if biopsy comes back positive for condyloma recommend treatment with Ln2. Advised patient that condylomas are sexually transmitted and if left untreated may cause a type of cancer called squamous cell carcinoma. They can be sexually transmitted and cause cervical cancer if females too. Discussed HPV Gardisil 9 HPV vaccine .   Lentigines - Scattered tan macules - Discussed due to sun exposure - Benign, observe - Call for any changes  Seborrheic Keratoses - Stuck-on, waxy, tan-brown papules and plaques  - Discussed benign etiology and prognosis. - Observe - Call for any changes  Melanocytic Nevi - Tan-brown and/or pink-flesh-colored symmetric macules and papules - Benign appearing on exam today - Observation -  Call clinic for new or changing moles - Recommend daily use of broad spectrum spf 30+ sunscreen to sun-exposed areas.   Hemangiomas - Red papules - Discussed benign nature - Observe - Call for any  changes  Actinic Damage - Chronic, secondary to cumulative UV/sun exposure - diffuse scaly erythematous macules with underlying dyspigmentation - Recommend daily broad spectrum sunscreen SPF 30+ to sun-exposed areas, reapply every 2 hours as needed.  - Call for new or changing lesions.  Skin cancer screening performed today.  Return if symptoms worsen or fail to improve, for pending pathology results.  Luther Redo, CMA, am acting as scribe for Sarina Ser, MD .  Documentation: I have reviewed the above documentation for accuracy and completeness, and I agree with the above.  Sarina Ser, MD

## 2020-05-28 ENCOUNTER — Encounter: Payer: Self-pay | Admitting: Dermatology

## 2020-06-01 ENCOUNTER — Telehealth: Payer: Self-pay

## 2020-06-01 NOTE — Telephone Encounter (Signed)
-----   Message from Ralene Bathe, MD sent at 05/29/2020  5:26 PM EDT ----- Diagnosis Skin , L prox mid penis PIGMENTED SEBORRHEIC KERATOSIS  Benign keratosis No evidence of wart virus in this specimen (but it is possible for there to be wart virus in others) May leave other areas alone and observe, but they may grow and more spots may occur. Or Some or all areas may be treated with Liquid Nitrogen freezing.  May schedule appt to do this if desired.

## 2020-06-01 NOTE — Telephone Encounter (Signed)
Advised patient of results. He had a lot of questions regarding warts and how to get the virus out of his body. I tried to explain to him that the biopsy did not show any evidence of wart virus but he could not understand how to rid himself of the virus. He thanked me for my time but said that he could not make me understand his questions and he hung up/hd

## 2020-06-30 ENCOUNTER — Other Ambulatory Visit: Payer: Self-pay

## 2020-06-30 ENCOUNTER — Ambulatory Visit: Payer: No Typology Code available for payment source | Admitting: Family Medicine

## 2020-06-30 ENCOUNTER — Ambulatory Visit: Payer: Medicaid Other | Attending: Family Medicine | Admitting: Family Medicine

## 2020-06-30 DIAGNOSIS — F419 Anxiety disorder, unspecified: Secondary | ICD-10-CM

## 2020-06-30 MED ORDER — HYDROXYZINE HCL 25 MG PO TABS: 25.0000 mg | ORAL_TABLET | Freq: Every evening | ORAL | 3 refills | Status: DC | PRN
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
## 2020-07-15 ENCOUNTER — Ambulatory Visit: Payer: Self-pay | Attending: Family Medicine | Admitting: Licensed Clinical Social Worker

## 2020-07-15 ENCOUNTER — Telehealth: Payer: Self-pay | Admitting: Family Medicine

## 2020-07-15 DIAGNOSIS — F332 Major depressive disorder, recurrent severe without psychotic features: Secondary | ICD-10-CM

## 2020-07-15 NOTE — Telephone Encounter (Signed)
Patient is calling jasmine regarding his appt. Please advise CB- [phone]

## 2020-07-15 NOTE — Telephone Encounter (Signed)
Please call patient

## 2020-07-16 ENCOUNTER — Other Ambulatory Visit: Payer: Self-pay

## 2020-07-24 NOTE — BH Specialist Note (Signed)
Integrated Behavioral Health via Telemedicine Visit  07/15/2020 Chad Johnson [id number]  Number of Integrated Behavioral Health visits: 1 Session Start time: 2:05 PM  Session End time: 2:35 PM Total time: 30  Referring Provider: Dr. Alvis Lemmings Patient/Family location: Home [facility] Provider location: Office All persons participating in visit: LCSW and Patient Types of Service: Individual psychotherapy  I connected with Chad Johnson by Telephone  (Video is Caregility application) and verified that I am speaking with the correct person using two identifiers.Discussed confidentiality: Yes   I discussed the limitations of telemedicine and the availability of in person appointments.  Discussed there is a possibility of technology failure and discussed alternative modes of communication if that failure occurs.  I discussed that engaging in this telemedicine visit, they consent to the provision of behavioral healthcare and the services will be billed under their insurance.  Patient and/or legal guardian expressed understanding and consented to Telemedicine visit: Yes   Presenting Concerns: Patient and/or family reports the following symptoms/concerns: Pt reports increase in depression and anxiety symptoms. "I have zero patience, no tolerance" Pt is not happy about how he treats loved ones when angry. Notes high anxiety about medications Duration of problem: 2019; Severity of problem: severe  Patient and/or Family's Strengths/Protective Factors: Social connections, Social and Emotional competence, Concrete supports in place (healthy food, safe environments, etc.) and Sense of purpose  Goals Addressed: Patient will: 1.  Increase knowledge and/or ability of: coping skills Pt agreed to remove self from triggers or going outside when experiencing overwhelming emotions 2.  Demonstrate ability to: Improve medication compliance Pt agreed to utilize medication to assist with management of  symptoms, if needed  Progress towards Goals: Ongoing  Interventions: Interventions utilized:  Solution-Focused Strategies, Supportive Counseling and Psychoeducation and/or Health Education Standardized Assessments completed: Not Needed  Patient Response: Pt engaged during session and assisted in identifying healthy strategies to manage symptoms  Assessment: Patient currently experiencing depression and anxiety symptoms that negatively impacts relationships with loved ones.    Patient may benefit from medication management and therapy. Healthy strategies identified to cope with anger and pt agreed to think about utilizing medication, if needed.  Plan: 1. Follow up with behavioral health clinician on : Pt agreed to schedule a follow up appointment 2. Behavioral recommendations: Utilize strategies discussed and comply with medication 3. Referral(s): Integrated [employer] (In Clinic)  I discussed the assessment and treatment plan with the patient and/or parent/guardian. They were provided an opportunity to ask questions and all were answered. They agreed with the plan and demonstrated an understanding of the instructions.   They were advised to call back or seek an in-person evaluation if the symptoms worsen or if the condition fails to improve as anticipated.  Bridgett Larsson, LCSW  07/24/2020 12:21 PM

## 2020-07-31 ENCOUNTER — Telehealth: Payer: Self-pay | Admitting: Family Medicine

## 2020-07-31 MED ORDER — TRAZODONE HCL 50 MG PO TABS: 50.0000 mg | ORAL_TABLET | Freq: Every evening | ORAL | 3 refills | Status: DC | PRN

## 2020-07-31 NOTE — Telephone Encounter (Signed)
Called patient to offer appointment on Jan 26th. Patient stated there was no use in speaking with Jasmine in 3 weeks he needed advice now. I advised patient I would route a message to CSW for follow up.    Patient also stated that his sleep medication is not working and is requesting a call back from his nurse or provider on what to do next. Reminded patient of his upcoming appointment with Dr. Margarita Rana on jan 31st. Please advise.

## 2020-07-31 NOTE — Telephone Encounter (Signed)
Prescription for Trazodone sent to Pharmacy for sleep.

## 2020-07-31 NOTE — Telephone Encounter (Signed)
Pt states that his sleep medication is not working.

## 2020-07-31 NOTE — Telephone Encounter (Signed)
Copied from [place] [phone]. Topic: Quick Communication - See Telephone Encounter >> Jul 30, 2020  4:31 PM L[place] wrote: CRM for notification. See Telephone encounter for: 07/30/20. Inquiring to [facility] cb [phone] wants an appt   Called patient and LVM to call the office back to reschedule appointment with [facility].

## 2020-08-01 ENCOUNTER — Other Ambulatory Visit: Payer: Medicaid Other

## 2020-08-03 NOTE — Telephone Encounter (Signed)
Pt was called and pt states that he does not want to take any more medications, he would like to know why he is not sleeping instead of taking medications.

## 2020-08-05 ENCOUNTER — Other Ambulatory Visit: Payer: Self-pay

## 2020-08-05 ENCOUNTER — Ambulatory Visit: Payer: Self-pay | Attending: Family Medicine | Admitting: Family Medicine

## 2020-08-05 DIAGNOSIS — G4709 Other insomnia: Secondary | ICD-10-CM

## 2020-08-05 DIAGNOSIS — I10 Essential (primary) hypertension: Secondary | ICD-10-CM

## 2020-08-05 DIAGNOSIS — I7103 Dissection of thoracoabdominal aorta: Secondary | ICD-10-CM

## 2020-08-05 MED ORDER — METOPROLOL TARTRATE 25 MG PO TABS: 12.5000 mg | ORAL_TABLET | Freq: Two times a day (BID) | ORAL | 3 refills | Status: DC

## 2020-08-05 NOTE — Progress Notes (Signed)
Pt states that he is not sleeping and he does not want to take any medication he want to find the reason why he is not sleeping.
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
## 2020-08-06 ENCOUNTER — Other Ambulatory Visit: Payer: Self-pay | Admitting: Family Medicine

## 2020-08-06 ENCOUNTER — Telehealth: Payer: Self-pay | Admitting: Pharmacist

## 2020-08-06 DIAGNOSIS — I10 Essential (primary) hypertension: Secondary | ICD-10-CM

## 2020-08-06 DIAGNOSIS — F419 Anxiety disorder, unspecified: Secondary | ICD-10-CM

## 2020-08-06 MED ORDER — HYDROXYZINE HCL 25 MG PO TABS: 25.0000 mg | ORAL_TABLET | Freq: Every evening | ORAL | 3 refills | Status: DC | PRN

## 2020-08-06 MED ORDER — TRAZODONE HCL 50 MG PO TABS: 50.0000 mg | ORAL_TABLET | Freq: Every evening | ORAL | 3 refills | Status: DC | PRN

## 2020-08-06 MED ORDER — HYDRALAZINE HCL 50 MG PO TABS: 50.0000 mg | ORAL_TABLET | Freq: Two times a day (BID) | ORAL | 6 refills | Status: DC

## 2020-08-06 MED ORDER — METOPROLOL TARTRATE 25 MG PO TABS: 12.5000 mg | ORAL_TABLET | Freq: Two times a day (BID) | ORAL | 3 refills | Status: DC

## 2020-08-06 MED FILL — hydrOXYzine HCL 25 MG TABS: 25 | 30 days supply | Qty: 30 | Fill #0

## 2020-08-06 MED FILL — traZODone HCL 50 MG TABS: 50 | 30 days supply | Qty: 30 | Fill #0

## 2020-08-06 MED FILL — hydrALAZINE HCL 50 MG TABS: 50 | 30 days supply | Qty: 60 | Fill #0

## 2020-08-06 MED FILL — METOPROLOL TARTRATE 25 MG T: 25 | 30 days supply | Qty: 30 | Fill #0

## 2020-08-06 NOTE — Telephone Encounter (Signed)
Call placed to patient. His copay are high because he only has [employer]. Therefore, we can fill his rxs here at our pharmacy for our discounted copays. I covered this with the patient and he is amenable to filling here. Rxs sent to our pharmacy.

## 2020-08-11 ENCOUNTER — Telehealth: Payer: Self-pay | Admitting: Family Medicine

## 2020-08-11 NOTE — Telephone Encounter (Signed)
Copied from [place] [id number]. Topic: General - Other >> Aug 11, 2020 12:13 PM Hinda Lenis D wrote:  Pt request a callback from [facility]  // please advise

## 2020-08-12 NOTE — Telephone Encounter (Signed)
Patient called again to speak with [facility]. He states that he has reached out for 2 weeks with no call back. He can be reached at [phone]. Please advise

## 2020-08-13 ENCOUNTER — Telehealth: Payer: Self-pay | Admitting: Family Medicine

## 2020-08-13 ENCOUNTER — Telehealth (INDEPENDENT_AMBULATORY_CARE_PROVIDER_SITE_OTHER): Payer: Self-pay

## 2020-08-13 NOTE — Telephone Encounter (Signed)
Copied from [place] [phone]. Topic: General - Inquiry >> Aug 12, 2020 10:18 AM Gillis Ends D wrote: Reason for CRM: Patient would like a call back from [facility] the financially advisor. He can be reached at [phone]. Please advise

## 2020-08-13 NOTE — Telephone Encounter (Signed)
Patient would like for you to call him to schedule an appointment. Nat Christen, CMA     Copied from [place] [phone]. Topic: General - Other >> Jul 31, 2020 10:06 AM Celene Kras wrote: Reason for CRM: Pt calling and is requesting to have [place] give him a call back to set up an appt. Please advise.

## 2020-08-20 ENCOUNTER — Ambulatory Visit (INDEPENDENT_AMBULATORY_CARE_PROVIDER_SITE_OTHER): Payer: Self-pay | Admitting: Licensed Clinical Social Worker

## 2020-08-20 ENCOUNTER — Other Ambulatory Visit: Payer: Self-pay

## 2020-08-20 DIAGNOSIS — F331 Major depressive disorder, recurrent, moderate: Secondary | ICD-10-CM

## 2020-08-21 ENCOUNTER — Ambulatory Visit: Payer: Medicaid Other

## 2020-08-24 ENCOUNTER — Telehealth: Payer: Self-pay | Admitting: Family Medicine

## 2020-08-24 ENCOUNTER — Ambulatory Visit: Payer: Medicaid Other | Admitting: Family Medicine

## 2020-08-24 NOTE — Telephone Encounter (Signed)
Copied from [place] [phone]. Topic: General - Other >> Aug 20, 2020  1:53 PM Erick Blinks wrote: Reason for CRM: Pt is requesting a call back from [facility], please advise. Patient may need to reschedule his appt tomorrow.  Best contact: [phone] >> Aug 24, 2020  1:06 PM Celene Kras wrote: Pt calling again and still has not received a call back. Please advise.

## 2020-08-25 ENCOUNTER — Institutional Professional Consult (permissible substitution): Payer: Medicaid Other | Admitting: Pulmonary Disease

## 2020-08-25 NOTE — Telephone Encounter (Signed)
I return pt call, LVM to call us back to see how we can help

## 2020-08-31 NOTE — BH Specialist Note (Signed)
Integrated Behavioral Health via Telemedicine Visit  08/20/20 Chad Johnson [id number]  Number of [place] visits: 2 Session Start time: 10:00 AM  Session End time: 10:30 AM Total time: 30  Referring Provider: Dr. Margarita Rana Patient/Family location: Home [name] Provider location: Office All persons participating in visit: LCSW and Patient Types of Service: Individual psychotherapy  I connected with [name] by Telephone  (Video is Caregility application) and verified that I am speaking with the correct person using two identifiers.Discussed confidentiality: Yes   I discussed the limitations of telemedicine and the availability of in person appointments.  Discussed there is a possibility of technology failure and discussed alternative modes of communication if that failure occurs.  I discussed that engaging in this telemedicine visit, they consent to the provision of behavioral healthcare and the services will be billed under their insurance.  Patient and/or legal guardian expressed understanding and consented to Telemedicine visit: Yes   Presenting Concerns: Patient and/or family reports the following symptoms/concerns: Pt reports increase in irritability triggered by marital and financial strain. Symptoms also include weight gain (25 lbs), difficulty obtaining sleep, and withdrawn behavior Duration of problem: Ongoing; Severity of problem: severe  Patient and/or Family's Strengths/Protective Factors: Social and Emotional competence  Goals Addressed: Patient will:  1.  Increase knowledge and/or ability of: self-management skills Pt agreed to utilize anger management strategies discussed 2.  Demonstrate ability to: Increase adequate support systems for patient/family Pt agreed to meet with Financial Counselor to obtain CAFA to assist with medical coverage  Progress towards Goals: Ongoing  Interventions: Interventions utilized:  Solution-Focused  Strategies and Supportive Counseling Standardized Assessments completed: Not Needed  Patient Response: Pt was engaged during session and successful in identifying strategies to assist with symptom management   Assessment: Patient currently experiencing difficulty managing depression symptoms.   Patient may benefit from establishing therapy through [facility] and following up with Financial Counselor to obtain CAFA. Pt is interested in psychological testing to evaluate for any additional behavioral health conditions.  Plan: 1. Follow up with behavioral health clinician on : 09/02/20 2. Behavioral recommendations: Utilize strategies discussed 3. Referral(s): Integrated Orthoptist (In Clinic) and [place] (LME/Outside Clinic)  I discussed the assessment and treatment plan with the patient and/or parent/guardian. They were provided an opportunity to ask questions and all were answered. They agreed with the plan and demonstrated an understanding of the instructions.   They were advised to call back or seek an in-person evaluation if the symptoms worsen or if the condition fails to improve as anticipated.  Chad Chesterfield, LCSW  08/31/20 8:59 AM

## 2020-09-02 ENCOUNTER — Ambulatory Visit: Payer: Medicaid Other | Admitting: Licensed Clinical Social Worker

## 2020-09-07 ENCOUNTER — Ambulatory Visit: Payer: No Typology Code available for payment source | Admitting: Dermatology

## 2020-11-28 ENCOUNTER — Other Ambulatory Visit: Payer: Self-pay | Admitting: Family Medicine

## 2020-11-28 DIAGNOSIS — I10 Essential (primary) hypertension: Secondary | ICD-10-CM

## 2020-11-28 NOTE — Telephone Encounter (Signed)
Requested Prescriptions  Pending Prescriptions Disp Refills  . hydrALAZINE (APRESOLINE) 50 MG tablet [Pharmacy Med Name: hydrALAZINE HCl 50 MG Oral Tablet] 60 tablet 0    Sig: Take 1 tablet by mouth twice daily     Cardiovascular:  Vasodilators Failed - 11/28/2020  9:12 AM      Failed - HCT in normal range and within 360 days    HCT  Date Value Ref Range Status  12/31/2017 38.1 (L) 39.0 - 52.0 % Final         Failed - HGB in normal range and within 360 days    Hemoglobin  Date Value Ref Range Status  12/31/2017 13.1 13.0 - 17.0 g/dL Final         Failed - RBC in normal range and within 360 days    RBC  Date Value Ref Range Status  12/31/2017 4.30 4.22 - 5.81 MIL/uL Final         Failed - WBC in normal range and within 360 days    WBC  Date Value Ref Range Status  12/31/2017 10.1 4.0 - 10.5 K/uL Final         Failed - PLT in normal range and within 360 days    Platelets  Date Value Ref Range Status  12/31/2017 372 150 - 400 K/uL Final    Comment:    Performed at [facility], [place] [address] [place], [place] [address]         Passed - Last BP in normal range    BP Readings from Last 1 Encounters:  03/10/20 120/75         Passed - Valid encounter within last 12 months    Recent Outpatient Visits          3 months ago Other insomnia   [place], Charlane Ferretti, MD   5 months ago [place], En[place]ong, MD   8 months ago 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

## 2020-12-12 ENCOUNTER — Emergency Department (HOSPITAL_COMMUNITY)
Admission: EM | Admit: 2020-12-12 | Discharge: 2020-12-12 | Disposition: A | Discharge status: Home / Self Care | Payer: Medicaid Other | Attending: Emergency Medicine | Admitting: Emergency Medicine

## 2020-12-12 ENCOUNTER — Emergency Department (HOSPITAL_COMMUNITY): Payer: Medicaid Other

## 2020-12-12 ENCOUNTER — Encounter (HOSPITAL_COMMUNITY): Payer: Self-pay | Admitting: *Deleted

## 2020-12-12 ENCOUNTER — Other Ambulatory Visit: Payer: Self-pay

## 2020-12-12 DIAGNOSIS — Z87891 Personal history of nicotine dependence: Secondary | ICD-10-CM | POA: Insufficient documentation

## 2020-12-12 DIAGNOSIS — Z79899 Other long term (current) drug therapy: Secondary | ICD-10-CM | POA: Insufficient documentation

## 2020-12-12 DIAGNOSIS — R079 Chest pain, unspecified: Secondary | ICD-10-CM | POA: Insufficient documentation

## 2020-12-12 DIAGNOSIS — R112 Nausea with vomiting, unspecified: Secondary | ICD-10-CM | POA: Insufficient documentation

## 2020-12-12 DIAGNOSIS — I10 Essential (primary) hypertension: Secondary | ICD-10-CM | POA: Insufficient documentation

## 2020-12-12 DIAGNOSIS — R1010 Upper abdominal pain, unspecified: Secondary | ICD-10-CM | POA: Insufficient documentation

## 2020-12-12 DIAGNOSIS — Z7982 Long term (current) use of aspirin: Secondary | ICD-10-CM | POA: Insufficient documentation

## 2020-12-12 DIAGNOSIS — Z20822 Contact with and (suspected) exposure to covid-19: Secondary | ICD-10-CM | POA: Insufficient documentation

## 2020-12-12 HISTORY — DX: Anxiety disorder, unspecified: F41.9

## 2020-12-12 LAB — COMPREHENSIVE METABOLIC PANEL
ALT: 15 U/L (ref 0–44)
AST: 18 U/L (ref 15–41)
Albumin: 4.4 g/dL (ref 3.5–5.0)
Alkaline Phosphatase: 57 U/L (ref 38–126)
Anion gap: 11 (ref 5–15)
BUN: 9 mg/dL (ref 6–20)
CO2: 18 mmol/L — ABNORMAL LOW (ref 22–32)
Calcium: 9.4 mg/dL (ref 8.9–10.3)
Chloride: 109 mmol/L (ref 98–111)
Creatinine, Ser: 0.83 mg/dL (ref 0.61–1.24)
GFR, Estimated: >60 mL/min (ref >60)
Glucose, Bld: 117 mg/dL — ABNORMAL HIGH (ref 70–99)
Potassium: 3.6 mmol/L (ref 3.5–5.1)
Sodium: 138 mmol/L (ref 135–145)
Total Bilirubin: 0.7 mg/dL (ref 0.3–1.2)
Total Protein: 7.1 g/dL (ref 6.5–8.1)

## 2020-12-12 LAB — URINALYSIS, ROUTINE W REFLEX MICROSCOPIC
Bilirubin Urine: NEGATIVE
Glucose, UA: NEGATIVE mg/dL
Hgb urine dipstick: NEGATIVE
Ketones, ur: 20 mg/dL — AB
Leukocytes,Ua: NEGATIVE
Nitrite: NEGATIVE
Protein, ur: NEGATIVE mg/dL
Specific Gravity, Urine: 1.034 — ABNORMAL HIGH (ref 1.005–1.030)
pH: 9 — ABNORMAL HIGH (ref 5.0–8.0)

## 2020-12-12 LAB — LIPASE, BLOOD: Lipase: 27 U/L (ref 11–51)

## 2020-12-12 LAB — TYPE AND SCREEN
ABO/RH(D): O POS
Antibody Screen: NEGATIVE

## 2020-12-12 LAB — RESP PANEL BY RT-PCR (FLU A&B, COVID) ARPGX2
Influenza A by PCR: NEGATIVE
Influenza B by PCR: NEGATIVE
SARS Coronavirus 2 by RT PCR: NEGATIVE

## 2020-12-12 LAB — CBC
HCT: 43 % (ref 39.0–52.0)
Hemoglobin: 15.1 g/dL (ref 13.0–17.0)
MCH: 30.9 pg (ref 26.0–34.0)
MCHC: 35.1 g/dL (ref 30.0–36.0)
MCV: 88.1 fL (ref 80.0–100.0)
Platelets: 365 10*3/uL (ref 150–400)
RBC: 4.88 MIL/uL (ref 4.22–5.81)
RDW: 12.8 % (ref 11.5–15.5)
WBC: 12.7 10*3/uL — ABNORMAL HIGH (ref 4.0–10.5)
nRBC: 0 % (ref 0.0–0.2)

## 2020-12-12 LAB — I-STAT CHEM 8, ED
BUN: 9 mg/dL (ref 6–20)
Calcium, Ion: 1.08 mmol/L — ABNORMAL LOW (ref 1.15–1.40)
Chloride: 111 mmol/L (ref 98–111)
Creatinine, Ser: 0.7 mg/dL (ref 0.61–1.24)
Glucose, Bld: 114 mg/dL — ABNORMAL HIGH (ref 70–99)
HCT: 43 % (ref 39.0–52.0)
Hemoglobin: 14.6 g/dL (ref 13.0–17.0)
Potassium: 3.5 mmol/L (ref 3.5–5.1)
Sodium: 141 mmol/L (ref 135–145)
TCO2: 19 mmol/L — ABNORMAL LOW (ref 22–32)

## 2020-12-12 LAB — TROPONIN I (HIGH SENSITIVITY)
Troponin I (High Sensitivity): 5 ng/L (ref <18)
Troponin I (High Sensitivity): 4 ng/L (ref <18)

## 2020-12-12 MED ORDER — FAMOTIDINE 20 MG PO TABS: 20.0000 mg | ORAL_TABLET | Freq: Two times a day (BID) | ORAL | 0 refills | Status: DC

## 2020-12-12 MED ORDER — SUCRALFATE 1 G PO TABS: 1.0000 g | ORAL_TABLET | Freq: Three times a day (TID) | ORAL | 0 refills | Status: DC

## 2020-12-12 MED ORDER — ALUM & MAG HYDROXIDE-SIMETH 200-200-20 MG/5ML PO SUSP
30.0000 mL | Freq: Once | ORAL | Status: AC
  Administered 2020-12-12: 30 mL via ORAL
  Filled 2020-12-12: qty 30

## 2020-12-12 MED ORDER — FAMOTIDINE IN NACL 20-0.9 MG/50ML-% IV SOLN
20.0000 mg | Freq: Once | INTRAVENOUS | Status: AC
  Administered 2020-12-12: 20 mg via INTRAVENOUS
  Filled 2020-12-12: qty 50

## 2020-12-12 MED ORDER — ONDANSETRON HCL 4 MG/2ML IJ SOLN
4.0000 mg | Freq: Once | INTRAMUSCULAR | Status: AC
  Administered 2020-12-12: 4 mg via INTRAVENOUS
  Filled 2020-12-12: qty 2

## 2020-12-12 MED ORDER — METOCLOPRAMIDE HCL 5 MG/ML IJ SOLN
10.0000 mg | Freq: Once | INTRAMUSCULAR | Status: AC
  Administered 2020-12-12: 10 mg via INTRAVENOUS
  Filled 2020-12-12: qty 2

## 2020-12-12 MED ORDER — HYDROMORPHONE HCL 1 MG/ML IJ SOLN
1.0000 mg | Freq: Once | INTRAMUSCULAR | Status: AC
Start: 2020-12-12 — End: 2020-12-12
  Administered 2020-12-12: 1 mg via INTRAVENOUS
  Filled 2020-12-12: qty 1

## 2020-12-12 MED ORDER — LIDOCAINE VISCOUS HCL 2 % MT SOLN
15.0000 mL | Freq: Once | OROMUCOSAL | Status: AC
  Administered 2020-12-12: 15 mL via ORAL
  Filled 2020-12-12: qty 15

## 2020-12-12 MED ORDER — IOHEXOL 350 MG/ML SOLN
75.0000 mL | Freq: Once | INTRAVENOUS | Status: AC | PRN
  Administered 2020-12-12: 75 mL via INTRAVENOUS

## 2020-12-12 MED ORDER — PANTOPRAZOLE SODIUM 20 MG PO TBEC: 20.0000 mg | DELAYED_RELEASE_TABLET | Freq: Every day | ORAL | 1 refills | Status: DC

## 2020-12-12 MED ORDER — ONDANSETRON 4 MG PO TBDP: 4.0000 mg | ORAL_TABLET | Freq: Three times a day (TID) | ORAL | 0 refills | Status: DC | PRN

## 2020-12-12 MED ORDER — LORAZEPAM 2 MG/ML IJ SOLN
0.5000 mg | Freq: Once | INTRAMUSCULAR | Status: AC
  Administered 2020-12-12: 0.5 mg via INTRAVENOUS
  Filled 2020-12-12: qty 1
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
## 2020-12-12 NOTE — ED Triage Notes (Addendum)
Pt from home c/o acute onset abd pain and vomiting this am.  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  PT was given 4 zofran en-route, but refused fentanyl.

## 2020-12-12 NOTE — Discharge Instructions (Addendum)
  Diet: Start with a clear liquid diet, progressed to a full liquid diet, and then bland solids as you are able. Please adhere to the enclosed dietary suggestions.  In general, avoid NSAIDs (i.e. ibuprofen, naproxen, etc.), caffeine, alcohol, spicy foods, fatty foods, or any other foods that seem to cause your symptoms to arise.  Protonix: Take this medication daily, 20-30 minutes prior to your first meal, for the next 8 weeks.  Continue to take this medication even if you begin to feel better.  Pepcid: Take this medication twice a day for the next 5 days.  Sucralfate: Sucralfate (generic for Carafate) is meant to soothe symptoms of abdominal discomfort and reflux.  Nausea/vomiting: Use the ondansetron (generic for Zofran) for nausea or vomiting.  This medication may not prevent all vomiting or nausea, but can help facilitate better hydration. Things that can help with nausea/vomiting also include peppermint/menthol candies, vitamin B12, and ginger.  Follow-up: Please follow-up with your primary care provider or gastroenterologist on this matter.  Call to make an appointment.  Return: Return to the ED for significantly worsening symptoms, persistent vomiting, persistent fever, vomiting blood, blood in the stools, dark stools, or any other major concerns.  For prescription assistance, may try using prescription discount sites or apps, such as goodrx.com

## 2021-01-16 ENCOUNTER — Other Ambulatory Visit: Payer: Self-pay | Admitting: Family Medicine

## 2021-01-16 DIAGNOSIS — I10 Essential (primary) hypertension: Secondary | ICD-10-CM

## 2021-01-16 NOTE — Telephone Encounter (Signed)
Requested Prescriptions  Pending Prescriptions Disp Refills  . hydrALAZINE (APRESOLINE) 50 MG tablet [Pharmacy Med Name: hydrALAZINE HCl 50 MG Oral Tablet] 60 tablet 0    Sig: Take 1 tablet by mouth twice daily     Cardiovascular:  Vasodilators Failed - 01/16/2021  9:32 AM      Failed - WBC in normal range and within 360 days    WBC  Date Value Ref Range Status  12/12/2020 12.7 (H) 4.0 - 10.5 K/uL Final         Failed - Last BP in normal range    BP Readings from Last 1 Encounters:  12/12/20 (!) 131/93         Passed - HCT in normal range and within 360 days    HCT  Date Value Ref Range Status  12/12/2020 43.0 39.0 - 52.0 % Final         Passed - HGB in normal range and within 360 days    Hemoglobin  Date Value Ref Range Status  12/12/2020 14.6 13.0 - 17.0 g/dL Final         Passed - RBC in normal range and within 360 days    RBC  Date Value Ref Range Status  12/12/2020 4.88 4.22 - 5.81 MIL/uL Final         Passed - PLT in normal range and within 360 days    Platelets  Date Value Ref Range Status  12/12/2020 365 150 - 400 K/uL Final         Passed - Valid encounter within last 12 months    Recent Outpatient Visits          5 months ago Other insomnia   [place], Charlane Ferretti, MD   6 months ago [place], [place]obong, MD   10 months ago [place], Enobong, MD   1 year ago 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)   [place], Enobong, MD   1 year ago Dissection of thoracoabdominal aorta [facility]   [place] [facility] Charlott Rakes, MD

## 2021-03-28 ENCOUNTER — Other Ambulatory Visit: Payer: Self-pay | Admitting: Family Medicine

## 2021-03-28 DIAGNOSIS — I10 Essential (primary) hypertension: Secondary | ICD-10-CM

## 2021-04-08 ENCOUNTER — Other Ambulatory Visit: Payer: Self-pay

## 2021-04-08 DIAGNOSIS — I7103 Dissection of thoracoabdominal aorta: Secondary | ICD-10-CM

## 2021-04-30 ENCOUNTER — Other Ambulatory Visit: Payer: Self-pay

## 2021-04-30 DIAGNOSIS — I7103 Dissection of thoracoabdominal aorta: Secondary | ICD-10-CM

## 2021-05-03 ENCOUNTER — Telehealth: Payer: Self-pay

## 2021-05-03 NOTE — Telephone Encounter (Signed)
-----   Message from Cathlean Cower sent at 04/30/2021  4:26 PM EDT ----- Regarding: RE: CTA 04/30/21 - left msg on vm for patient.   ad ----- Message ----- From: Kaleen Mask, LPN Sent: 05/28/6430   9:05 AM EDT To: April H Pait, Jillyn Hidden, # Subject: CTA                                            Please schedule prior to appt on 05/10/21. Thanks

## 2021-05-04 ENCOUNTER — Other Ambulatory Visit: Payer: Medicaid Other

## 2021-05-07 ENCOUNTER — Telehealth: Payer: Self-pay

## 2021-05-07 NOTE — Telephone Encounter (Signed)
Patient coming in for 24 month follow up, had a CT scan earlier this year. Per MD, patient does not need another CT scan prior to appt. Left VM to inform.

## 2021-05-10 ENCOUNTER — Ambulatory Visit (HOSPITAL_COMMUNITY): Payer: Self-pay

## 2021-05-10 ENCOUNTER — Ambulatory Visit: Payer: Medicaid Other | Admitting: Surgery

## 2021-05-17 ENCOUNTER — Encounter: Payer: Medicaid Other | Admitting: Family Medicine

## 2021-05-24 ENCOUNTER — Ambulatory Visit (INDEPENDENT_AMBULATORY_CARE_PROVIDER_SITE_OTHER): Payer: Self-pay | Admitting: Surgery

## 2021-05-24 ENCOUNTER — Ambulatory Visit: Payer: Medicaid Other | Attending: Physician Assistant | Admitting: Physician Assistant

## 2021-05-24 ENCOUNTER — Encounter: Payer: Self-pay | Admitting: Physician Assistant

## 2021-05-24 ENCOUNTER — Other Ambulatory Visit: Payer: Self-pay

## 2021-05-24 ENCOUNTER — Encounter: Payer: Self-pay | Admitting: Surgery

## 2021-05-24 VITALS — BP 119/77 | HR 69 | Temp 98.7°F | Resp 20 | Ht 74.0 in | Wt 181.0 lb

## 2021-05-24 VITALS — BP 143/94 | HR 78 | Temp 98.9°F | Resp 18 | Ht 74.0 in | Wt 178.0 lb

## 2021-05-24 DIAGNOSIS — I10 Essential (primary) hypertension: Secondary | ICD-10-CM

## 2021-05-24 DIAGNOSIS — I7103 Dissection of thoracoabdominal aorta: Secondary | ICD-10-CM

## 2021-05-24 DIAGNOSIS — R739 Hyperglycemia, unspecified: Secondary | ICD-10-CM

## 2021-05-24 DIAGNOSIS — K5909 Other constipation: Secondary | ICD-10-CM

## 2021-05-24 DIAGNOSIS — G4709 Other insomnia: Secondary | ICD-10-CM

## 2021-05-24 DIAGNOSIS — K625 Hemorrhage of anus and rectum: Secondary | ICD-10-CM

## 2021-05-24 DIAGNOSIS — F439 Reaction to severe stress, unspecified: Secondary | ICD-10-CM

## 2021-05-24 DIAGNOSIS — R42 Dizziness and giddiness: Secondary | ICD-10-CM

## 2021-05-24 LAB — POCT GLYCOSYLATED HEMOGLOBIN (HGB A1C): Hemoglobin A1C: 5.3 % (ref 4.0–5.6)

## 2021-05-24 MED ORDER — TRAZODONE HCL 50 MG PO TABS: 50.0000 mg | ORAL_TABLET | Freq: Every evening | ORAL | 1 refills | Status: DC | PRN

## 2021-05-24 MED ORDER — HYDRALAZINE HCL 50 MG PO TABS: 50.0000 mg | ORAL_TABLET | Freq: Two times a day (BID) | ORAL | 0 refills | Status: DC

## 2021-05-24 MED ORDER — METOPROLOL TARTRATE 25 MG PO TABS: 25.0000 mg | ORAL_TABLET | Freq: Two times a day (BID) | ORAL | 0 refills | Status: DC
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
## 2021-05-24 NOTE — Progress Notes (Signed)
Patient presents for physical. Patient has not eaten or taken medication today. Patient denies pain at this time.

## 2021-05-24 NOTE — Patient Instructions (Signed)
I strongly encourage you to work on improving your sleep, you should be sleeping 7 to 8 hours a night.  I encourage you to do the trial of trazodone, 25 mg to 50 mg at bedtime.  Over the next couple days, I do encourage you to use an over-the-counter stool softener to help with your rectal bleeding.  Make sure that you are staying consistent with your water and fiber intake.  I also encourage you to schedule an eye exam to make sure that is not the cause of your dizziness.  We will call you with today's lab results.  Kennieth Rad, PA-C Physician Assistant [facility] [email or web address]   Managing Anxiety, Adult After being diagnosed with an anxiety disorder, you may be relieved to know why you have felt or behaved a certain way. You may also feel overwhelmed about the treatment ahead and what it will mean for your life. With care and support, you can manage this condition and recover from it. How to manage lifestyle changes Managing stress and anxiety Stress is your body's reaction to life changes and events, both good and bad. Most stress will last just a few hours, but stress can be ongoing and can lead to more than just stress. Although stress can play a major role in anxiety, it is not the same as anxiety. Stress is usually caused by something external, such as a deadline, test, or competition. Stress normally passes after the triggering event has ended.  Anxiety is caused by something internal, such as imagining a terrible outcome or worrying that something will go wrong that will devastate you. Anxiety often does not go away even after the triggering event is over, and it can become long-term (chronic) worry. It is important to understand the differences between stress and anxiety and to manage your stress effectively so that it does not lead to an anxious response. Talk with your health care provider or a counselor to learn more  about reducing anxiety and stress. He or she may suggest tension reduction techniques, such as: Music therapy. This can include creating or listening to music that you enjoy and that inspires you. Mindfulness-based meditation. This involves being aware of your normal breaths while not trying to control your breathing. It can be done while sitting or walking. Centering prayer. This involves focusing on a word, phrase, or sacred image that means something to you and brings you peace. Deep breathing. To do this, expand your stomach and inhale slowly through your nose. Hold your breath for 3-5 seconds. Then exhale slowly, letting your stomach muscles relax. Self-talk. This involves identifying thought patterns that lead to anxiety reactions and changing those patterns. Muscle relaxation. This involves tensing muscles and then relaxing them. Choose a tension reduction technique that suits your lifestyle and personality. These techniques take time and practice. Set aside 5-15 minutes a day to do them. Therapists can offer counseling and training in these techniques. The training to help with anxiety may be covered by some insurance plans. Other things you can do to manage stress and anxiety include: Keeping a stress/anxiety diary. This can help you learn what triggers your reaction and then learn ways to manage your response. Thinking about how you react to certain situations. You may not be able to control everything, but you can control your response. Making time for activities that help you relax and not feeling guilty about spending your time in this way. Visual imagery and yoga can help you stay calm  and relax.  Medicines Medicines can help ease symptoms. Medicines for anxiety include: Anti-anxiety drugs. Antidepressants. Medicines are often used as a primary treatment for anxiety disorder. Medicines will be prescribed by a health care provider. When used together, medicines, psychotherapy, and  tension reduction techniques may be the most effective treatment. Relationships Relationships can play a big part in helping you recover. Try to spend more time connecting with trusted friends and family members. Consider going to couples counseling, taking family education classes, or going to family therapy. Therapy can help you and others better understand your condition. How to recognize changes in your anxiety Everyone responds differently to treatment for anxiety. Recovery from anxiety happens when symptoms decrease and stop interfering with your daily activities at home or work. This may mean that you will start to: Have better concentration and focus. Worry will interfere less in your daily thinking. Sleep better. Be less irritable. Have more energy. Have improved memory. It is important to recognize when your condition is getting worse. Contact your health care provider if your symptoms interfere with home or work and you feel like your condition is not improving. Follow these instructions at home: Activity Exercise. Most adults should do the following: Exercise for at least 150 minutes each week. The exercise should increase your heart rate and make you sweat (moderate-intensity exercise). Strengthening exercises at least twice a week. Get the right amount and quality of sleep. Most adults need 7-9 hours of sleep each night. Lifestyle  Eat a healthy diet that includes plenty of vegetables, fruits, whole grains, low-fat dairy products, and lean protein. Do not eat a lot of foods that are high in solid fats, added sugars, or salt. Make choices that simplify your life. Do not use any products that contain nicotine or tobacco, such as cigarettes, e-cigarettes, and chewing tobacco. If you need help quitting, ask your health care provider. Avoid caffeine, alcohol, and certain over-the-counter cold medicines. These may make you feel worse. Ask your pharmacist which medicines to  avoid. General instructions Take over-the-counter and prescription medicines only as told by your health care provider. Keep all follow-up visits as told by your health care provider. This is important. Where to find support You can get help and support from these sources: Self-help groups. Online and [employer]. A trusted spiritual leader. Couples counseling. Family education classes. Family therapy. Where to find more information You may find that joining a support group helps you deal with your anxiety. The following sources can help you locate counselors or support groups near you: [place]: www.mentalhealthamerica.net Anxiety and Depression Association of [place] (ADAA): https://www.clark.net/ National Alliance on Mental Illness (NAMI): www.nami.org Contact a health care provider if you: Have a hard time staying focused or finishing daily tasks. Spend many hours a day feeling worried about everyday life. Become exhausted by worry. Start to have headaches, feel tense, or have nausea. Urinate more than normal. Have diarrhea. Get help right away if you have: A racing heart and shortness of breath. Thoughts of hurting yourself or others. If you ever feel like you may hurt yourself or others, or have thoughts about taking your own life, get help right away. You can go to your nearest emergency department or call: Your local emergency services (911 in the [place] A suicide crisis helpline, such as the [place] at 418-699-8064. This is open 24 hours a day. Summary Taking steps to learn and use tension reduction techniques can help calm you and help prevent  triggering an anxiety reaction. When used together, medicines, psychotherapy, and tension reduction techniques may be the most effective treatment. Family, friends, and partners can play a big part in helping you recover from an anxiety disorder. This information is not intended to  replace advice given to you by your health care provider. Make sure you discuss any questions you have with your health care provider. Document Revised: 12/11/2018 Document Reviewed: 12/11/2018 Elsevier Patient Education  [place].   Dizziness Dizziness is a common problem. It is a feeling of unsteadiness or light-headedness. You may feel like you are about to faint. Dizziness can lead to injury if you stumble or fall. Anyone can become dizzy, but dizziness is more common in older adults. This condition can be caused by a number of things, including medicines, dehydration, or illness. Follow these instructions at home: Eating and drinking  Drink enough fluid to keep your urine pale yellow. This helps to keep you from becoming dehydrated. Try to drink more clear fluids, such as water. Do not drink alcohol. Limit your caffeine intake if told to do so by your health care provider. Check ingredients and nutrition facts to see if a food or beverage contains caffeine. Limit your salt (sodium) intake if told to do so by your health care provider. Check ingredients and nutrition facts to see if a food or beverage contains sodium. Activity  Avoid making quick movements. Rise slowly from chairs and steady yourself until you feel okay. In the morning, first sit up on the side of the bed. When you feel okay, stand slowly while you hold onto something until you know that your balance is good. If you need to stand in one place for a long time, move your legs often. Tighten and relax the muscles in your legs while you are standing. Do not drive or use machinery if you feel dizzy. Avoid bending down if you feel dizzy. Place items in your home so that they are easy for you to reach without leaning over. Lifestyle Do not use any products that contain nicotine or tobacco. These products include cigarettes, chewing tobacco, and vaping devices, such as e-cigarettes. If you need help quitting, ask your  health care provider. Try to reduce your stress level by using methods such as yoga or meditation. Talk with your health care provider if you need help to manage your stress. General instructions Watch your dizziness for any changes. Take over-the-counter and prescription medicines only as told by your health care provider. Talk with your health care provider if you think that your dizziness is caused by a medicine that you are taking. Tell a friend or a family member that you are feeling dizzy. If he or she notices any changes in your behavior, have this person call your health care provider. Keep all follow-up visits. This is important. Contact a health care provider if: Your dizziness does not go away or you have new symptoms. Your dizziness or light-headedness gets worse. You feel nauseous. You have reduced hearing. You have a fever. You have neck pain or a stiff neck. Your dizziness leads to an injury or a fall. Get help right away if: You vomit or have diarrhea and are unable to eat or drink anything. You have problems talking, walking, swallowing, or using your arms, hands, or legs. You feel generally weak. You have any bleeding. You are not thinking clearly or you have trouble forming sentences. It may take a friend or family member to notice this.  You have chest pain, abdominal pain, shortness of breath, or sweating. Your vision changes or you develop a severe headache. These symptoms may represent a serious problem that is an emergency. Do not wait to see if the symptoms will go away. Get medical help right away. Call your local emergency services (911 in the [place] Do not drive yourself to the hospital. Summary Dizziness is a feeling of unsteadiness or light-headedness. This condition can be caused by a number of things, including medicines, dehydration, or illness. Anyone can become dizzy, but dizziness is more common in older adults. Drink enough fluid to keep your urine pale  yellow. Do not drink alcohol. Avoid making quick movements if you feel dizzy. Monitor your dizziness for any changes. This information is not intended to replace advice given to you by your health care provider. Make sure you discuss any questions you have with your health care provider. Document Revised: 06/15/2020 Document Reviewed: 06/15/2020 Elsevier Patient Education  2022 [employer].
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
## 2021-05-25 ENCOUNTER — Encounter: Payer: Self-pay | Admitting: Physician Assistant

## 2021-05-25 LAB — CBC WITH DIFFERENTIAL/PLATELET
Basophils Absolute: 0.1 10*3/uL (ref 0.0–0.2)
Basos: 1 %
EOS (ABSOLUTE): 0.4 10*3/uL (ref 0.0–0.4)
Eos: 6 %
Hematocrit: 45 % (ref 37.5–51.0)
Hemoglobin: 15.1 g/dL (ref 13.0–17.7)
Immature Grans (Abs): 0 10*3/uL (ref 0.0–0.1)
Immature Granulocytes: 0 %
Lymphocytes Absolute: 2.4 10*3/uL (ref 0.7–3.1)
Lymphs: 33 %
MCH: 30.3 pg (ref 26.6–33.0)
MCHC: 33.6 g/dL (ref 31.5–35.7)
MCV: 90 fL (ref 79–97)
Monocytes Absolute: 0.4 10*3/uL (ref 0.1–0.9)
Monocytes: 6 %
Neutrophils Absolute: 3.8 10*3/uL (ref 1.4–7.0)
Neutrophils: 54 %
Platelets: 384 10*3/uL (ref 150–450)
RBC: 4.98 x10E6/uL (ref 4.14–5.80)
RDW: 13.1 % (ref 11.6–15.4)
WBC: 7.1 10*3/uL (ref 3.4–10.8)

## 2021-05-25 LAB — COMP. METABOLIC PANEL (12)
AST: 18 IU/L (ref 0–40)
Albumin/Globulin Ratio: 2.6 — ABNORMAL HIGH (ref 1.2–2.2)
Albumin: 5.1 g/dL — ABNORMAL HIGH (ref 4.0–5.0)
Alkaline Phosphatase: 72 IU/L (ref 44–121)
BUN/Creatinine Ratio: 12 (ref 9–20)
BUN: 9 mg/dL (ref 6–24)
Bilirubin Total: 0.4 mg/dL (ref 0.0–1.2)
Calcium: 9.5 mg/dL (ref 8.7–10.2)
Chloride: 103 mmol/L (ref 96–106)
Creatinine, Ser: 0.78 mg/dL (ref 0.76–1.27)
Globulin, Total: 2 g/dL (ref 1.5–4.5)
Glucose: 89 mg/dL (ref 70–99)
Potassium: 4.3 mmol/L (ref 3.5–5.2)
Sodium: 140 mmol/L (ref 134–144)
Total Protein: 7.1 g/dL (ref 6.0–8.5)
eGFR: 116 mL/min/{1.73_m2} (ref >59)

## 2021-05-25 LAB — TSH: TSH: 1.69 u[IU]/mL (ref 0.450–4.500)

## 2021-05-26 ENCOUNTER — Telehealth: Payer: Self-pay

## 2021-05-26 NOTE — Telephone Encounter (Signed)
Pt saw MD this week and could not remember which of the 2 b/p meds that he is on that MD told him he could try stopping to see how his b/p maintains. Per MD, of the 2 b/p meds pt is on the Hydralazine is the one he can try to stop taking. Pt verbalized understanding. No further questions/concerns at this time.

## 2021-05-27 NOTE — Telephone Encounter (Signed)
-----   Message from Kennieth Rad, [place] sent at 05/25/2021 12:48 PM EDT ----- Please call patient and let him know that his thyroid function, kidney and liver function are within normal limits, his albumin is slightly elevated, however insignificant.  He does not show signs of anemia.

## 2021-07-08 ENCOUNTER — Telehealth: Payer: Medicaid Other | Admitting: Physician Assistant

## 2021-07-08 ENCOUNTER — Ambulatory Visit: Payer: Self-pay | Admitting: *Deleted

## 2021-07-08 DIAGNOSIS — H5712 Ocular pain, left eye: Secondary | ICD-10-CM

## 2021-07-08 DIAGNOSIS — H5789 Other specified disorders of eye and adnexa: Secondary | ICD-10-CM

## 2021-07-08 NOTE — Patient Instructions (Signed)
°  Chad Johnson, thank you for joining Mar Daring, PA-C for today's virtual visit.  While this provider is not your primary care provider (PCP), if your PCP is located in our provider database this encounter information will be shared with them immediately following your visit.  Consent: (Patient) Chad Johnson provided verbal consent for this virtual visit at the beginning of the encounter.  Current Medications:  Current Outpatient Medications:    aspirin EC 81 MG tablet, Take 1 tablet (81 mg total) by mouth daily., Disp: 150 tablet, Rfl: 2   cetirizine (ZYRTEC) 10 MG chewable tablet, Chew 10 mg by mouth daily., Disp: , Rfl:    fluticasone (FLONASE) 50 MCG/ACT nasal spray, Place 2 sprays into both nostrils daily., Disp: , Rfl:    hydrALAZINE (APRESOLINE) 50 MG tablet, Take 1 tablet (50 mg total) by mouth 2 (two) times daily., Disp: 90 tablet, Rfl: 0   metoprolol tartrate (LOPRESSOR) 25 MG tablet, Take 1 tablet (25 mg total) by mouth 2 (two) times daily., Disp: 90 tablet, Rfl: 0   traZODone (DESYREL) 50 MG tablet, Take 1 tablet (50 mg total) by mouth at bedtime as needed for sleep., Disp: 30 tablet, Rfl: 1   Medications ordered in this encounter:  No orders of the defined types were placed in this encounter.    *If you need refills on other medications prior to your next appointment, please contact your pharmacy*  Follow-Up: Call back or seek an in-person evaluation if the symptoms worsen or if the condition fails to improve as anticipated.  Other Instructions Be Seen URGENTLY for acute red, painful eye  [facility] [phone]   [facility] [phone] and [phone] and [phone]  (3 locations)   [facility] [phone]   [facility] [place] [phone] or [phone]    If you have been instructed to have an in-person evaluation today at a local Urgent Care facility, please use the link below. It will take you to a list of all of our available [facility], including address, phone number and hours of operation. Please do not delay care.  [facility]  If you or a family member do not have a primary care provider, use the link below to schedule a visit and establish care. When you choose a [facility] primary care physician or advanced practice provider, you gain a long-term partner in health. Find a Primary Care Provider  Learn more about [facility]'s in-office and virtual care options: [facility]
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
## 2021-07-08 NOTE — Telephone Encounter (Signed)
°  Chief Complaint: eye redness and sore Symptoms: redness sore and sensitivity to light  Frequency: Monday went away and came back today Pertinent Negatives: Patient denies fever and drainage Disposition: [] ED /[] Urgent Care (no appt availability in office) / [] Appointment(In office/virtual)/ [x]  [facility]/ [] Home Care/ [] Refused Recommended Disposition  Additional Notes: Pt states he had symptoms Monday went away for 2 days and then came back this morning. Said sensitivity to light was severe this morning when first woke up, not as bad as day goes on. He is wanting to make sure it isn't pink eye or anything contagious. Advised him no appts in office or virtual. Office is closed for lunch so unable to reach out to see if anything can be worked in but scheduled pt for virtual UC visit at 1315.    Reason for Disposition  Looking at light causes MODERATE to SEVERE eye pain (i.e., photophobia)  Answer Assessment - Initial Assessment Questions 1. ONSET: "When did the pain start?" (e.g., minutes, hours, days)     Monday  2. TIMING: "Does the pain come and go, or has it been constant since it started?" (e.g., constant, intermittent, fleeting)     Comes and go 3. SEVERITY: "How bad is the pain?"   (Scale 1-10; mild, moderate or severe)   - MILD (1-3): doesn't interfere with normal activities    - MODERATE (4-7): interferes with normal activities or awakens from sleep    - SEVERE (8-10): excruciating pain and patient unable to do normal activities     4-5 4. LOCATION: "Where does it hurt?"  (e.g., eyelid, eye, cheekbone)     L eye outer part of eye the white part 5. CAUSE: "What do you think is causing the pain?"     unsure 6. VISION: "Do you have blurred vision or changes in your vision?"      Sensitivity to light 7. EYE DISCHARGE: "Is there any discharge (pus) from the eye(s)?"  If yes, ask: "What color is it?"      No 9. OTHER SYMPTOMS: "Do you have any other symptoms?" (e.g.,  headache, nasal discharge, facial rash)     Sensitivity to light this morning  Protocols used: Eye Pain and Other Symptoms-A-AH

## 2021-07-08 NOTE — Progress Notes (Signed)
Virtual Visit Consent   Chad Johnson, you are scheduled for a virtual visit with a [facility] provider today.     Just as with appointments in the office, your consent must be obtained to participate.  Your consent will be active for this visit and any virtual visit you may have with one of our providers in the next 365 days.     If you have a MyChart account, a copy of this consent can be sent to you electronically.  All virtual visits are billed to your insurance company just like a traditional visit in the office.    As this is a virtual visit, video technology does not allow for your provider to perform a traditional examination.  This may limit your provider's ability to fully assess your condition.  If your provider identifies any concerns that need to be evaluated in person or the need to arrange testing (such as labs, EKG, etc.), we will make arrangements to do so.     Although advances in technology are sophisticated, we cannot ensure that it will always work on either your end or our end.  If the connection with a video visit is poor, the visit may have to be switched to a telephone visit.  With either a video or telephone visit, we are not always able to ensure that we have a secure connection.     I need to obtain your verbal consent now.   Are you willing to proceed with your visit today?    Chad Johnson has provided verbal consent on 07/08/2021 for a virtual visit (video or telephone).   Mar Daring, PA-C   Date: 07/08/2021 1:30 PM   Virtual Visit via Video Note   I, Mar Daring, connected with  Chad Johnson  ([id number], [id number]) on 07/08/21 at  1:15 PM EST by a video-enabled telemedicine application and verified that I am speaking with the correct person using two identifiers.  Location: Patient: Virtual Visit Location Patient: Home Provider: Virtual Visit Location Provider: Home Office   I discussed the limitations of evaluation and  management by telemedicine and the availability of in person appointments. The patient expressed understanding and agreed to proceed.    History of Present Illness: Chad Johnson is a 40 y.o. who identifies as a male who was assigned male at birth, and is being seen today for possible pink eye.  HPI: Conjunctivitis  The current episode started today (Started Monday, but went away). The onset was sudden. The problem has been unchanged. Nothing relieves the symptoms. The symptoms are aggravated by light and movement. Associated symptoms include decreased vision (with movements), photophobia, eye pain and eye redness. Pertinent negatives include no fever, no double vision, no congestion, no ear discharge, no ear pain, no headaches and no eye discharge. The eye pain is moderate. The left eye is affected. The eye pain is associated with movement. The eyelid exhibits no abnormality.     Problems:  Patient Active Problem List   Diagnosis Date Noted   Aortic dissection ([place]) 12/27/2017   Accelerated hypertension 12/25/2017   Fatigue 10/03/2011   Chronic pain due to injury 10/03/2011   GAD (generalized anxiety disorder) 10/03/2011   Somatic dysfunction of cervical region 10/03/2011    Allergies: No Known Allergies Medications:  Current Outpatient Medications:    aspirin EC 81 MG tablet, Take 1 tablet (81 mg total) by mouth daily., Disp: 150 tablet, Rfl: 2   cetirizine (ZYRTEC) 10 MG  chewable tablet, Chew 10 mg by mouth daily., Disp: , Rfl:    fluticasone (FLONASE) 50 MCG/ACT nasal spray, Place 2 sprays into both nostrils daily., Disp: , Rfl:    hydrALAZINE (APRESOLINE) 50 MG tablet, Take 1 tablet (50 mg total) by mouth 2 (two) times daily., Disp: 90 tablet, Rfl: 0   metoprolol tartrate (LOPRESSOR) 25 MG tablet, Take 1 tablet (25 mg total) by mouth 2 (two) times daily., Disp: 90 tablet, Rfl: 0   traZODone (DESYREL) 50 MG tablet, Take 1 tablet (50 mg total) by mouth at bedtime as needed for  sleep., Disp: 30 tablet, Rfl: 1  Observations/Objective: Patient is well-developed, well-nourished in no acute distress.  Resting comfortably at home.  Head is normocephalic, atraumatic.  No labored breathing.  Speech is clear and coherent with logical content.  Patient is alert and oriented at baseline.  Left eye sclera is injected Patient reports pain with eye movement, intermittent "blackening" of vision with quick movements, and severe photophobia (even with overcast day outside hurts)  Assessment and Plan: 1. Red eye  2. Pain of left eye  - Worrisome findings of acute, painful red eye without any signs of infection - Suspect possible Scleritis, Iritis, or acute angle glaucoma  - Discussed seeing an ophthalmologist urgently (ASAP); He does not have an eye doctor regularly   Follow Up Instructions: I discussed the assessment and treatment plan with the patient. The patient was provided an opportunity to ask questions and all were answered. The patient agreed with the plan and demonstrated an understanding of the instructions.  A copy of instructions were sent to the patient via MyChart unless otherwise noted below.    The patient was advised to call back or seek an in-person evaluation if the symptoms worsen or if the condition fails to improve as anticipated.  Time:  I spent 13 minutes with the patient via telehealth technology discussing the above problems/concerns.    Mar Daring, PA-C

## 2021-07-09 ENCOUNTER — Other Ambulatory Visit: Payer: Self-pay

## 2021-07-09 ENCOUNTER — Telehealth: Payer: Self-pay | Admitting: Family Medicine

## 2021-07-09 ENCOUNTER — Ambulatory Visit: Payer: Medicaid Other | Attending: Family Medicine

## 2021-07-09 DIAGNOSIS — H2000 Unspecified acute and subacute iridocyclitis: Secondary | ICD-10-CM

## 2021-07-09 DIAGNOSIS — H5712 Ocular pain, left eye: Secondary | ICD-10-CM

## 2021-07-09 NOTE — Telephone Encounter (Signed)
Orders has been placed and patient has had lab work done.

## 2021-07-09 NOTE — Telephone Encounter (Signed)
Copied from [place] [phone]. Topic: Referral - Request for Referral >> Jul 08, 2021  1:43 PM Pawlus, Brayton Layman A wrote: Pt called in stating he was told during his virtual visit today that he needs a referral from Dr Margarita Rana to an eye doctor / specialist, please advise.

## 2021-07-09 NOTE — Telephone Encounter (Signed)
Pt is needing referral to eye doctor. Recent urgent care note in chart.

## 2021-07-09 NOTE — Telephone Encounter (Signed)
Copied from [place] [phone]. Topic: General - Other >> Jul 09, 2021 11:12 AM Pawlus, Brayton Layman A wrote: Reason for CRM: Pt wanted to have labs done, please advise if lab orders can be placed.

## 2021-07-09 NOTE — Telephone Encounter (Signed)
Referral has been placed. 

## 2021-07-15 ENCOUNTER — Encounter: Payer: Self-pay | Admitting: Family Medicine

## 2021-07-15 ENCOUNTER — Other Ambulatory Visit: Payer: Self-pay | Admitting: Family Medicine

## 2021-07-15 DIAGNOSIS — R768 Other specified abnormal immunological findings in serum: Secondary | ICD-10-CM

## 2021-07-16 ENCOUNTER — Ambulatory Visit: Payer: Self-pay | Attending: Family Medicine

## 2021-07-16 ENCOUNTER — Ambulatory Visit (HOSPITAL_COMMUNITY)
Admission: RE | Admit: 2021-07-16 | Discharge: 2021-07-16 | Disposition: A | Discharge status: Home / Self Care | Payer: Medicaid Other | Source: Ambulatory Visit | Attending: Family Medicine | Admitting: Family Medicine

## 2021-07-16 ENCOUNTER — Other Ambulatory Visit: Payer: Self-pay

## 2021-07-16 DIAGNOSIS — R768 Other specified abnormal immunological findings in serum: Secondary | ICD-10-CM

## 2021-07-21 ENCOUNTER — Telehealth: Payer: Self-pay | Admitting: Family Medicine

## 2021-07-21 ENCOUNTER — Encounter: Payer: Self-pay | Admitting: Family Medicine

## 2021-07-21 NOTE — Telephone Encounter (Signed)
Pt was called and given an appointment for 07/28/2021 at 9:10

## 2021-07-21 NOTE — Telephone Encounter (Signed)
Can you please assist him in securing an in person appointment with me to follow-up on his blood pressure?  Thank you.

## 2021-07-23 LAB — ANCA PROFILE (RDL)
ANCA by IFA (RDL): NEGATIVE
Anti-MPO Ab (RDL): <20 Units (ref <20)
Anti-PR-3 Ab (RDL): <20 Units (ref <20)

## 2021-07-23 LAB — ANA 12 PLUS PROFILE (RDL): Anti-Nuclear Ab by IFA (RDL): POSITIVE — AB

## 2021-07-23 LAB — LYME DISEASE, WESTERN BLOT
IgG P18 Ab.: ABSENT
IgG P23 Ab.: ABSENT
IgG P28 Ab.: ABSENT
IgG P30 Ab.: ABSENT
IgG P39 Ab.: ABSENT
IgG P41 Ab.: ABSENT
IgG P45 Ab.: ABSENT
IgG P58 Ab.: ABSENT
IgG P66 Ab.: ABSENT
IgG P93 Ab.: ABSENT
IgM P23 Ab.: ABSENT
IgM P39 Ab.: ABSENT
IgM P41 Ab.: ABSENT
Lyme IgG Wb: NEGATIVE
Lyme IgM Wb: NEGATIVE

## 2021-07-23 LAB — ANA 12 PLUS PROFILE, POSITIVE
Anti-CCP Ab, IgG & IgA (RDL): <20 Units (ref <20)
Anti-Cardiolipin Ab, IgA (RDL): <12 APL U/mL (ref <12)
Anti-Cardiolipin Ab, IgG (RDL): <15 GPL U/mL (ref <15)
Anti-Cardiolipin Ab, IgM (RDL): <13 MPL U/mL (ref <13)
Anti-Centromere Ab (RDL): <1:40 {titer}
Anti-Chromatin Ab, IgG (RDL): <20 Units (ref <20)
Anti-La (SS-B) Ab (RDL): <20 Units (ref <20)
Anti-Ro (SS-A) Ab (RDL): <20 Units (ref <20)
Anti-Scl-70 Ab (RDL): <20 Units (ref <20)
Anti-Sm Ab (RDL): <20 Units (ref <20)
Anti-TPO Ab (RDL): <9 IU/mL (ref <9.0)
Anti-U1 RNP Ab (RDL): <20 Units (ref <20)
Anti-dsDNA Ab by Farr(RDL): <8 IU/mL (ref <8.0)
C3 Complement (RDL): 137 mg/dL (ref 82–167)
C4 Complement (RDL): 36 mg/dL (ref 14–44)
Speckled Pattern: 1:320 {titer} — ABNORMAL HIGH

## 2021-07-23 LAB — RHEUMATOID FACTOR: Rheumatoid fact SerPl-aCnc: <10 IU/mL (ref <14.0)

## 2021-07-23 LAB — T PALLIDUM SCREENING CASCADE: T pallidum Antibodies (TP-PA): NONREACTIVE

## 2021-07-23 LAB — ANGIOTENSIN CONVERTING ENZYME: Angio Convert Enzyme: 56 U/L (ref 14–82)

## 2021-07-23 LAB — HLA-B27 ANTIGEN: HLA B27: NEGATIVE

## 2021-07-23 LAB — LYSOZYME, SERUM: Lysozyme: 6.1 ug/mL (ref 3.0–12.8)

## 2021-07-23 LAB — RPR: RPR Ser Ql: NONREACTIVE

## 2021-07-27 LAB — HLA-B27 ANTIGEN: HLA B27: NEGATIVE

## 2021-07-28 ENCOUNTER — Ambulatory Visit: Payer: Medicaid Other | Admitting: Family Medicine

## 2021-07-29 ENCOUNTER — Ambulatory Visit: Payer: Medicaid Other

## 2021-07-30 ENCOUNTER — Telehealth: Payer: Self-pay | Admitting: Family Medicine

## 2021-07-30 ENCOUNTER — Ambulatory Visit: Payer: Medicaid Other

## 2021-07-30 NOTE — Telephone Encounter (Signed)
Called Pt to reschedule appt with PCP. Wanted to know why he needed to come to clinic for PCP just to take BP to have med refill. Pt declined to schedule apt.

## 2021-12-09 ENCOUNTER — Telehealth: Payer: Self-pay

## 2021-12-09 ENCOUNTER — Ambulatory Visit
Admission: EM | Admit: 2021-12-09 | Discharge: 2021-12-09 | Disposition: A | Discharge status: Home / Self Care | Payer: Medicaid Other | Attending: Emergency Medicine | Admitting: Emergency Medicine

## 2021-12-09 ENCOUNTER — Telehealth: Payer: Medicaid Other | Admitting: Family Medicine

## 2021-12-09 ENCOUNTER — Encounter (HOSPITAL_COMMUNITY): Payer: Self-pay | Admitting: Emergency Medicine

## 2021-12-09 ENCOUNTER — Other Ambulatory Visit: Payer: Self-pay

## 2021-12-09 ENCOUNTER — Emergency Department (HOSPITAL_COMMUNITY)
Admission: EM | Admit: 2021-12-09 | Discharge: 2021-12-09 | Disposition: A | Discharge status: Home / Self Care | Payer: Medicaid Other | Attending: Emergency Medicine | Admitting: Emergency Medicine

## 2021-12-09 ENCOUNTER — Ambulatory Visit: Payer: Self-pay

## 2021-12-09 DIAGNOSIS — Z7982 Long term (current) use of aspirin: Secondary | ICD-10-CM | POA: Diagnosis not present

## 2021-12-09 DIAGNOSIS — K644 Residual hemorrhoidal skin tags: Secondary | ICD-10-CM | POA: Insufficient documentation

## 2021-12-09 DIAGNOSIS — K6289 Other specified diseases of anus and rectum: Secondary | ICD-10-CM | POA: Diagnosis not present

## 2021-12-09 DIAGNOSIS — K645 Perianal venous thrombosis: Secondary | ICD-10-CM

## 2021-12-09 DIAGNOSIS — K643 Fourth degree hemorrhoids: Secondary | ICD-10-CM | POA: Diagnosis not present

## 2021-12-09 DIAGNOSIS — K649 Unspecified hemorrhoids: Secondary | ICD-10-CM | POA: Diagnosis present

## 2021-12-09 MED ORDER — PRAMOXINE HCL (PERIANAL) 1 % EX FOAM: 1.0000 "application " | Freq: Three times a day (TID) | CUTANEOUS | 0 refills | Status: DC | PRN

## 2021-12-09 MED ORDER — LIDOCAINE HCL (PF) 1 % IJ SOLN
5.0000 mL | Freq: Once | INTRAMUSCULAR | Status: AC
  Administered 2021-12-09: 5 mL
  Filled 2021-12-09: qty 5

## 2021-12-09 MED ORDER — LIDOCAINE-EPINEPHRINE-TETRACAINE (LET) TOPICAL GEL
3.0000 mL | Freq: Once | TOPICAL | Status: AC
  Administered 2021-12-09: 3 mL via TOPICAL

## 2021-12-09 MED ORDER — HYDROCODONE-ACETAMINOPHEN 5-325 MG PO TABS: 2.0000 | ORAL_TABLET | ORAL | 0 refills | Status: DC | PRN

## 2021-12-09 MED ORDER — LIDOCAINE 5 % EX OINT: 1.0000 "application " | TOPICAL_OINTMENT | CUTANEOUS | 0 refills | Status: DC | PRN

## 2021-12-09 MED ORDER — OXYCODONE-ACETAMINOPHEN 5-325 MG PO TABS
1.0000 | ORAL_TABLET | Freq: Once | ORAL | Status: AC
Start: 2021-12-09 — End: 2021-12-09
  Administered 2021-12-09: 1 via ORAL
  Filled 2021-12-09: qty 1

## 2021-12-09 NOTE — Telephone Encounter (Signed)
Spoke with the patient letting him know he would need to go to the ER after speaking with the surgeon due to what he was  needing to be seen for. Patient said show me an ER where he doesn't have to wait hours to be seen,  I let him know I spoke with the surgeon in the office who stated it would be best for him to go to the ER we can not do the surgery in office. Patient stated he was not to going to [facility] and I let him know if he didn't want to go  to [facility] we have other hospitals in the area and he said thank you for your time and hung the phone up.

## 2021-12-09 NOTE — Telephone Encounter (Signed)
    Chief Complaint: Rectal pain/possible hemorrhoid  Symptoms: Pain,swelling Frequency: Yesterday Pertinent Negatives: Patient denies itching Disposition: '[]'$ ED /'[]'$ Urgent Care (no appt availability in office) / '[]'$ Appointment(In office/virtual)/ '[x]'$  [facility]/ '[]'$ Home Care/ '[]'$ Refused Recommended Disposition /'[]'$ [facility]/ '[]'$  Follow-up with PCP Additional Notes: Has tried OTC treatments without relief. No availability in the practice.  Reason for Disposition  [1] Sudden onset rectal pain AND [2] constipated (straining, rectal pressure or fullness) AND [3] NOT better after SITZ bath, suppository or enema  Answer Assessment - Initial Assessment Questions 1. SYMPTOM:  "What's the main symptom you're concerned about?" (e.g., pain, itching, swelling, rash)     Pain 2. ONSET: "When did the pain  start?"     Yesterday 3. RECTAL PAIN: "Do you have any pain around your rectum?" "How bad is the pain?"  (Scale 0-10; or mild, moderate, severe)   - NONE (0): no pain   - MILD (1-3): doesn't interfere with normal activities    - MODERATE (4-7): interferes with normal activities or awakens from sleep, limping    - SEVERE (8-10): excruciating pain, unable to have a bowel movement      Severe 4. RECTAL ITCHING: "Do you have any itching in this area?" "How bad is the itching?"  (Scale 0-10; or mild, moderate, severe)   - NONE: no itching   - MILD: doesn't interfere with normal activities    - MODERATE-SEVERE: interferes with normal activities or awakens from sleep     No 5. CONSTIPATION: "Do you have constipation?" If Yes, ask: "How bad is it?"     No 6. CAUSE: "What do you think is causing the anus symptoms?"     N/a 7. OTHER SYMPTOMS: "Do you have any other symptoms?"  (e.g., abdomen pain, fever, rectal bleeding, vomiting)     No 8. PREGNANCY: "Is there any chance you are pregnant?" "When was your last menstrual period?"     N/a  Protocols used: Rectal Symptoms-A-AH
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
## 2021-12-09 NOTE — ED Provider Triage Note (Signed)
Emergency Medicine Provider Triage Evaluation Note  Chad Johnson , a 41 y.o. male  was evaluated in triage.  Pt complains of hemorrhoid. Noticed tissue protruding last night and then gradually over the course the night became more painful.  He has tried Tucks, Preparation H, lidocaine, Epsom salt baths but continues to have pain.  Sent to the ER from urgent care.  Denies any bloody stools.  Review of Systems  Positive: Hemorrhoid Negative: Bloody stools  Physical Exam  BP (!) 135/108 (BP Location: Left Arm)   Pulse 85   Temp 98 F (36.7 C) (Oral)   Resp 16   SpO2 99%  Gen:   Awake, no distress   Resp:  Normal effort  MSK:   Moves extremities without difficulty  Other:    Medical Decision Making  Medically screening exam initiated at 2:33 PM.  Appropriate orders placed.  NOBLE CICALESE was informed that the remainder of the evaluation will be completed by another provider, this initial triage assessment does not replace that evaluation, and the importance of remaining in the ED until their evaluation is complete.  Will need physical exam.   Delia Heady, PA-C 12/09/21 1446

## 2021-12-09 NOTE — Patient Instructions (Signed)
Proceed to the nearest Urgent Care for in person eval of your pain.

## 2021-12-09 NOTE — Discharge Instructions (Addendum)
We do need to call gastro today about getting an appointment Cautious with using lidocaine this can cause heart arrhythmias use as needed Take pain medicine as needed do not drive while taking I recommend that you be seen in the emergency room if not able to be seen by gastro Continue to use of Preparation H

## 2021-12-09 NOTE — Progress Notes (Signed)
Chad Johnson   Sent to be seen in person for questionable rectal pain etiology- suspect hemorrhoid, however pt reports no history of this in past. Reports rectum is fully blocked and is unsure if trial use of suppository would benefit. Recommended in person- pt on way now to UC in [place]- closest to his location.  Patient acknowledged agreement and understanding of the plan.

## 2021-12-09 NOTE — ED Triage Notes (Signed)
Pt c/o an inch of growth is "coming out of his butt" x2days.   Pt denies any lifting weights, or any strenuous activity.   Pt took an [employer] but states that it has gotten larger.

## 2021-12-09 NOTE — ED Provider Notes (Signed)
[facility] EMERGENCY DEPARTMENT Provider Note   CSN: [id number] Arrival date & time: 12/09/21  1422     History  Chief Complaint  Patient presents with   Hemorrhoids    Chad Johnson is a 41 y.o. male.  Patient presents to the hospital with a 1 day history of prolapsed hemorrhoid.  Patient states that he felt it last evening at approximately 6 PM.  Patient states that he has severe pain.  He denies straining.  He was seen at urgent care earlier today and diagnosed with a grade 4 hemorrhoid.  Urgent care attempted to get the patient seen by GI, who recommended the patient see general surgery, who stated they cannot see the patient today.  Patient was referred to the emergency department for treatment.  HPI     Home Medications Prior to Admission medications   Medication Sig Start Date End Date Taking? Authorizing Provider  aspirin EC 81 MG tablet Take 1 tablet (81 mg total) by mouth daily. 01/10/18   Rowe Clack, MD  cetirizine (ZYRTEC) 10 MG chewable tablet Chew 10 mg by mouth daily.    [provider]  fluticasone (FLONASE) 50 MCG/ACT nasal spray Place 2 sprays into both nostrils daily.    [provider]  hydrALAZINE (APRESOLINE) 50 MG tablet Take 1 tablet (50 mg total) by mouth 2 (two) times daily. 05/24/21   Mayers, Cari S, PA-C  HYDROcodone-acetaminophen (NORCO/VICODIN) 5-325 MG tablet Take 2 tablets by mouth every 4 (four) hours as needed. 12/09/21   Marney Setting, NP  lidocaine (XYLOCAINE) 5 % ointment Apply 1 application. topically as needed. 12/09/21   Marney Setting, NP  metoprolol tartrate (LOPRESSOR) 25 MG tablet Take 1 tablet (25 mg total) by mouth 2 (two) times daily. 05/24/21   Mayers, Cari S, PA-C  pramoxine (PROCTOFOAM) 1 % foam Place 1 application. rectally 3 (three) times daily as needed for anal itching. 12/09/21   Marney Setting, NP  traZODone (DESYREL) 50 MG tablet Take 1 tablet (50 mg total) by mouth  at bedtime as needed for sleep. 05/24/21 05/24/22  Mayers, Cari S, PA-C      Allergies    Patient has no known allergies.    Review of Systems   Review of Systems  Gastrointestinal:  Positive for rectal pain.   Physical Exam Updated Vital Signs BP (!) 135/108 (BP Location: Left Arm)   Pulse 85   Temp 98 F (36.7 C) (Oral)   Resp 16   SpO2 99%  Physical Exam Vitals and nursing note reviewed.  Constitutional:      General: He is in acute distress.     Appearance: He is normal weight.  HENT:     Head: Normocephalic.  Eyes:     Conjunctiva/sclera: Conjunctivae normal.  Cardiovascular:     Rate and Rhythm: Normal rate.  Pulmonary:     Effort: Pulmonary effort is normal.  Genitourinary:    Rectum: External hemorrhoid present.  Musculoskeletal:     Cervical back: Normal range of motion.  Skin:    General: Skin is warm and dry.  Neurological:     Mental Status: He is alert.    ED Results / Procedures / Treatments   Labs (all labs ordered are listed, but only abnormal results are displayed) Labs Reviewed - No data to display  EKG None  Radiology No results found.  Procedures .Marland KitchenIncision and Drainage  Date/Time: 12/09/2021 5:00 PM Performed by: Dorothyann Peng, PA-C  Authorized by: Dorothyann Peng, PA-C   Consent:    Consent obtained:  Verbal   Consent given by:  Patient   Risks discussed:  Bleeding and incomplete drainage   Alternatives discussed:  No treatment and alternative treatment Universal protocol:    Procedure explained and questions answered to patient or proxy's satisfaction: yes     Relevant documents present and verified: yes     Required blood products, implants, devices, and special equipment available: yes     Immediately prior to procedure, a time out was called: yes     Patient identity confirmed:  Verbally with patient Location:    Type:  External thrombosed hemorrhoid   Size:  5cm   Location:  Anogenital Pre-procedure details:     Skin preparation:  Povidone-iodine Sedation:    Sedation type:  None Anesthesia:    Anesthesia method:  Local infiltration   Local anesthetic:  Lidocaine 1% w/o epi Procedure type:    Complexity:  Simple Procedure details:    Incision types:  Single straight   Drainage:  Bloody   Wound treatment:  Wound left open Post-procedure details:    Procedure completion:  Tolerated with difficulty    Medications Ordered in ED Medications  oxyCODONE-acetaminophen (PERCOCET/ROXICET) 5-325 MG per tablet 1 tablet (has no administration in time range)  lidocaine (PF) (XYLOCAINE) 1 % injection 5 mL (has no administration in time range)    ED Course/ Medical Decision Making/ A&P                           Medical Decision Making Risk Prescription drug management.   Patient presents for evaluation and management of a thrombosed hemorrhoid.  I ordered oxycodone for pain.  The patient's pain improved after administration.  I drained the hemorrhoid as noted in procedure note above.  The patient tolerated the procedure.  Discharge home at this time with general surgery follow-up.  The patient may use sitz bath's at home.  He may also use pain medication as prescribed by urgent care earlier in the day.  Final Clinical Impression(s) / ED Diagnoses Final diagnoses:  External hemorrhoid, thrombosed    Rx / DC Orders ED Discharge Orders     None         Ronny Bacon 12/09/21 1819    Milton Ferguson, MD 12/13/21 [phone]

## 2021-12-09 NOTE — ED Triage Notes (Signed)
Patient referred to ED for evaluation of large prolapsed hemorrhoid.

## 2021-12-09 NOTE — Discharge Instructions (Addendum)
You were treated today for a thrombosed hemorrhoid.  Recommend using sitz bath's at home.  I provided contact information for general surgery.  Contact them as needed for further management of your hemorrhoids.

## 2022-02-15 ENCOUNTER — Ambulatory Visit: Payer: Self-pay

## 2022-02-15 ENCOUNTER — Telehealth: Payer: Medicaid Other | Admitting: Physician Assistant

## 2022-02-15 DIAGNOSIS — F419 Anxiety disorder, unspecified: Secondary | ICD-10-CM | POA: Diagnosis not present

## 2022-02-15 MED ORDER — HYDROXYZINE PAMOATE 25 MG PO CAPS: 25.0000 mg | ORAL_CAPSULE | Freq: Three times a day (TID) | ORAL | 0 refills | Status: DC | PRN

## 2022-02-15 NOTE — Patient Instructions (Signed)
  Chad Johnson, thank you for joining [name], PA-C for today's virtual visit.  While this provider is not your primary care provider (PCP), if your PCP is located in our provider database this encounter information will be shared with them immediately following your visit.  Consent: (Patient) Chad Johnson provided verbal consent for this virtual visit at the beginning of the encounter.  Current Medications:  Current Outpatient Medications:    hydrOXYzine (VISTARIL) 25 MG capsule, Take 1 capsule (25 mg total) by mouth every 8 (eight) hours as needed., Disp: 30 capsule, Rfl: 0   aspirin EC 81 MG tablet, Take 1 tablet (81 mg total) by mouth daily., Disp: 150 tablet, Rfl: 2   cetirizine (ZYRTEC) 10 MG chewable tablet, Chew 10 mg by mouth daily., Disp: , Rfl:    fluticasone (FLONASE) 50 MCG/ACT nasal spray, Place 2 sprays into both nostrils daily., Disp: , Rfl:    HYDROcodone-acetaminophen (NORCO/VICODIN) 5-325 MG tablet, Take 2 tablets by mouth every 4 (four) hours as needed., Disp: 10 tablet, Rfl: 0   lidocaine (XYLOCAINE) 5 % ointment, Apply 1 application. topically as needed., Disp: 35.44 g, Rfl: 0   pramoxine (PROCTOFOAM) 1 % foam, Place 1 application. rectally 3 (three) times daily as needed for anal itching., Disp: 15 g, Rfl: 0   Medications ordered in this encounter:  Meds ordered this encounter  Medications   hydrOXYzine (VISTARIL) 25 MG capsule    Sig: Take 1 capsule (25 mg total) by mouth every 8 (eight) hours as needed.    Dispense:  30 capsule    Refill:  0    Order Specific Question:   Supervising Provider    Answer:   Sabra Heck, [place]     *If you need refills on other medications prior to your next appointment, please contact your pharmacy*  Follow-Up: Call back or seek an in-person evaluation if the symptoms worsen or if the condition fails to improve as anticipated.  Other Instructions I have sent a message to your primary care provider to see  about getting you in for a follow-up -- hopefully via video.  Use the hydroxyzine as directed for acute anxiety to help ease this while we are getting you back in for evaluation.   If you have any thoughts of harming yourself or others, I want you to be evaluated at nearest ER ASAP.      If you have been instructed to have an in-person evaluation today at a local Urgent Care facility, please use the link below. It will take you to a list of all of our available [facility], including address, phone number and hours of operation. Please do not delay care.  [facility]  If you or a family member do not have a primary care provider, use the link below to schedule a visit and establish care. When you choose a [facility] primary care physician or advanced practice provider, you gain a long-term partner in health. Find a Primary Care Provider  Learn more about [facility]'s in-office and virtual care options: [facility]

## 2022-02-15 NOTE — Telephone Encounter (Signed)
  Chief Complaint: Anxiety Symptoms: In bed for 3 days Frequency: ongoing Pertinent Negatives: Patient denies self harm - but does want this to stop Disposition: '[]'$ ED /'[]'$ Urgent Care (no appt availability in office) / '[x]'$ Appointment(In office/virtual)/ '[]'$  [facility]/ '[]'$ Home Care/ '[]'$ Refused Recommended Disposition /'[]'$ [facility]/ '[]'$  Follow-up with PCP Additional Notes: Pt states he has not been out of bed in 3 days. Wants disability. Does not want anymore medication or therapy.  Made virtual appt per pt request. Reason for Disposition  Patient sounds very upset or troubled to the triager  Answer Assessment - Initial Assessment Questions 1. CONCERN: "Did anything happen that prompted you to call today?"      Trying to find out about disability 2. ANXIETY SYMPTOMS: "Can you describe how you (your loved one; patient) have been feeling?" (e.g., tense, restless, panicky, anxious, keyed up, overwhelmed, sense of impending doom).      All of that 3. ONSET: "How long have you been feeling this way?" (e.g., hours, days, weeks)     Years 4. SEVERITY: "How would you rate the level of anxiety?" (e.g., 0 - 10; or mild, moderate, severe).     Not out of bed in 3 days 5. FUNCTIONAL IMPAIRMENT: "How have these feelings affected your ability to do daily activities?" "Have you had more difficulty than usual doing your normal daily activities?" (e.g., getting better, same, worse; self-care, school, work, interactions)     yes 6. HISTORY: "Have you felt this way before?" "Have you ever been diagnosed with an anxiety problem in the past?" (e.g., generalized anxiety disorder, panic attacks, PTSD). If Yes, ask: "How was this problem treated?" (e.g., medicines, counseling, etc.)     yes 7. RISK OF HARM - SUICIDAL IDEATION: "Do you ever have thoughts of hurting or killing yourself?" If Yes, ask:  "Do you have these feelings now?" "Do you have a plan on how you would do this?"     Wants this to  stop 8. TREATMENT:  "What has been done so far to treat this anxiety?" (e.g., medicines, relaxation strategies). "What has helped?"      9. TREATMENT - THERAPIST: "Do you have a counselor or therapist? Name?"     Goes to therapy 10. POTENTIAL TRIGGERS: "Do you drink caffeinated beverages (e.g., coffee, colas, teas), and how much daily?" "Do you drink alcohol or use any drugs?" "Have you started any new medicines recently?"       no 11. PATIENT SUPPORT: "Who is with you now?" "Who do you live with?" "Do you have family or friends who you can talk to?"        Wife, small child 71. OTHER SYMPTOMS: "Do you have any other symptoms?" (e.g., feeling depressed, trouble concentrating, trouble sleeping, trouble breathing, palpitations or fast heartbeat, chest pain, sweating, nausea, or diarrhea)       All of the above 13. PREGNANCY: "Is there any chance you are pregnant?" "When was your last menstrual period?"       na  Protocols used: Anxiety and Panic Attack-A-AH

## 2022-02-15 NOTE — Progress Notes (Signed)
Virtual Visit Consent   Chad Johnson, you are scheduled for a virtual visit with a [facility] provider today. Just as with appointments in the office, your consent must be obtained to participate. Your consent will be active for this visit and any virtual visit you may have with one of our providers in the next 365 days. If you have a MyChart account, a copy of this consent can be sent to you electronically.  As this is a virtual visit, video technology does not allow for your provider to perform a traditional examination. This may limit your provider's ability to fully assess your condition. If your provider identifies any concerns that need to be evaluated in person or the need to arrange testing (such as labs, EKG, etc.), we will make arrangements to do so. Although advances in technology are sophisticated, we cannot ensure that it will always work on either your end or our end. If the connection with a video visit is poor, the visit may have to be switched to a telephone visit. With either a video or telephone visit, we are not always able to ensure that we have a secure connection.  By engaging in this virtual visit, you consent to the provision of healthcare and authorize for your insurance to be billed (if applicable) for the services provided during this visit. Depending on your insurance coverage, you may receive a charge related to this service.  I need to obtain your verbal consent now. Are you willing to proceed with your visit today? Chad Johnson has provided verbal consent on 02/15/2022 for a virtual visit (video or telephone). Chad Johnson, [place]  Date: 02/15/2022 2:43 PM  Virtual Visit via Video Note   I, Chad Johnson, connected with  Chad Johnson  ([id number], Nov 24, 1980) on 02/15/22 at  2:15 PM EDT by a video-enabled telemedicine application and verified that I am speaking with the correct person using two identifiers.  Location: Patient: Virtual Visit  Location Patient: Home Provider: Virtual Visit Location Provider: Home Office   I discussed the limitations of evaluation and management by telemedicine and the availability of in person appointments. The patient expressed understanding and agreed to proceed.    History of Present Illness: Chad Johnson is a 41 y.o. who identifies as a male who was assigned male at birth, and is being seen today for ongoing significant anxiety and request for disability. Patient initially reached out to PCP office for appointment but none available, was transferred to speak with triage nurse who scheduled him for virtual urgent care visit.  Patient endorses long-standing history of anxiety, previously on multiple medication regimens but has not taken in some time as he states he always felt worse on medications. Is still seeing a therapist but is having a hard time increasing frequency of visits due to cost. Notes dealing with some level of anxiety on a regular basis but at least a few times per month is having several consecutive days of severe anxiety to where he cannot get out of bed. Notes he is currently on day #3 of one of these episodes. Notes anxiety, irritability, decreased sleep, depressed mood and anhedonia. Denies SI/HI. States he just wishes there was a way to stop having these episodes.   HPI: HPI  Problems:  Patient Active Problem List   Diagnosis Date Noted   Aortic dissection ([place]) 12/27/2017   Accelerated hypertension 12/25/2017   Fatigue 10/03/2011   Chronic pain due to injury 10/03/2011  GAD (generalized anxiety disorder) 10/03/2011   Somatic dysfunction of cervical region 10/03/2011    Allergies: No Known Allergies Medications:  Current Outpatient Medications:    hydrOXYzine (VISTARIL) 25 MG capsule, Take 1 capsule (25 mg total) by mouth every 8 (eight) hours as needed., Disp: 30 capsule, Rfl: 0   aspirin EC 81 MG tablet, Take 1 tablet (81 mg total) by mouth daily., Disp: 150  tablet, Rfl: 2   cetirizine (ZYRTEC) 10 MG chewable tablet, Chew 10 mg by mouth daily., Disp: , Rfl:    fluticasone (FLONASE) 50 MCG/ACT nasal spray, Place 2 sprays into both nostrils daily., Disp: , Rfl:    HYDROcodone-acetaminophen (NORCO/VICODIN) 5-325 MG tablet, Take 2 tablets by mouth every 4 (four) hours as needed., Disp: 10 tablet, Rfl: 0   lidocaine (XYLOCAINE) 5 % ointment, Apply 1 application. topically as needed., Disp: 35.44 g, Rfl: 0   pramoxine (PROCTOFOAM) 1 % foam, Place 1 application. rectally 3 (three) times daily as needed for anal itching., Disp: 15 g, Rfl: 0  Observations/Objective: Patient is well-developed, well-nourished.  Anxious and tearful in bed. Head is normocephalic, atraumatic.  No labored breathing. Speech is clear and coherent with logical content.  Patient is alert and oriented at baseline.   Assessment and Plan: 1. Anxiety - hydrOXYzine (VISTARIL) 25 MG capsule; Take 1 capsule (25 mg total) by mouth every 8 (eight) hours as needed.  Dispense: 30 capsule; Refill: 0  Discussed need to follow-up with PCP in terms of ongoing anxiety and consideration for FMLA versus disability. Discussed may require evaluation with a specialist in addition to counseling. Message sent to PCP to make her aware. Discussed we cannot do disability or FMLA, or start chronic medications via this visit type. He does not want any medication at present but does agree to a PRN Hydroxyzine at this provider's recommendation. ER precautions reviewed. Hopefully can get him in for a video visit with PCP ASAP to get things sorted out.   Follow Up Instructions: I discussed the assessment and treatment plan with the patient. The patient was provided an opportunity to ask questions and all were answered. The patient agreed with the plan and demonstrated an understanding of the instructions.  A copy of instructions were sent to the patient via MyChart unless otherwise noted below.   The patient was  advised to call back or seek an in-person evaluation if the symptoms worsen or if the condition fails to improve as anticipated.  Time:  I spent 15 minutes with the patient via telehealth technology discussing the above problems/concerns.    Chad Rio, PA-C

## 2022-02-17 NOTE — Telephone Encounter (Signed)
Patient had virtual appt with Elyn Aquas, PA-C day of call to address concerns.

## 2022-03-02 ENCOUNTER — Other Ambulatory Visit: Payer: Self-pay

## 2022-03-02 ENCOUNTER — Ambulatory Visit: Payer: Medicaid Other | Attending: Family Medicine | Admitting: Family Medicine

## 2022-03-02 ENCOUNTER — Ambulatory Visit: Payer: Self-pay

## 2022-03-02 ENCOUNTER — Encounter: Payer: Self-pay | Admitting: Family Medicine

## 2022-03-02 ENCOUNTER — Telehealth: Payer: Self-pay

## 2022-03-02 VITALS — BP 133/87 | HR 79 | Temp 98.0°F | Ht 74.0 in | Wt 177.0 lb

## 2022-03-02 DIAGNOSIS — F411 Generalized anxiety disorder: Secondary | ICD-10-CM

## 2022-03-02 DIAGNOSIS — H209 Unspecified iridocyclitis: Secondary | ICD-10-CM | POA: Diagnosis not present

## 2022-03-02 DIAGNOSIS — M1380 Other specified arthritis, unspecified site: Secondary | ICD-10-CM | POA: Insufficient documentation

## 2022-03-02 DIAGNOSIS — I7103 Dissection of thoracoabdominal aorta: Secondary | ICD-10-CM

## 2022-03-02 DIAGNOSIS — M1990 Unspecified osteoarthritis, unspecified site: Secondary | ICD-10-CM | POA: Insufficient documentation

## 2022-03-02 MED ORDER — DULOXETINE HCL 60 MG PO CPEP: 60.0000 mg | ORAL_CAPSULE | Freq: Every day | ORAL | 3 refills | Status: DC

## 2022-03-02 NOTE — Telephone Encounter (Signed)
Requested medications are due for refill today.  unsure  Requested medications are on the active medications list.  yes  Last refill. unknown  Future visit scheduled.   yes  Notes to clinic.  Medication is historical.    Requested Prescriptions  Pending Prescriptions Disp Refills   fluticasone (FLONASE) 50 MCG/ACT nasal spray      Sig: Place 2 sprays into both nostrils daily.     Ear, Nose, and Throat: Nasal Preparations - Corticosteroids Passed - 03/02/2022  4:54 PM      Passed - Valid encounter within last 12 months    Recent Outpatient Visits           Today GAD (generalized anxiety disorder)   [place], Charlane Ferretti, MD   9 months ago [place] [facility], [place], [place]   1 year ago Other insomnia   [place], Charlane Ferretti, MD   1 year ago [place], Charlane Ferretti, MD   1 year ago [place], MD       Future Appointments             In 6 months Charlott Rakes, MD [facility]
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
## 2022-03-02 NOTE — Telephone Encounter (Signed)
  Chief Complaint: Missing medication Symptoms: Allergy medication Frequency: now Pertinent Negatives: Patient denies  Disposition: '[]'$ ED /'[]'$ Urgent Care (no appt availability in office) / '[]'$ Appointment(In office/virtual)/ '[]'$  [facility]/ '[]'$ Home Care/ '[]'$ Refused Recommended Disposition /'[]'$ [place] [facility]/ '[x]'$  Follow-up with PCP Additional Notes: PT thought that he was going to have his Flonase refilled today at the office visit.  Medication was not at pharmacy for pt to pick up.  Sent refill request.     Summary: Flonase refill for allergies   Patient saw the provider today and is currently at the pharmacy waiting for his refill on his flonase but it is not there. Patietn has chronic allergies and needs this refill as soon as possible            Answer Assessment - Initial Assessment Questions 1. DRUG NAME: "What medicine do you need to have refilled?"     Flonase 2. REFILLS REMAINING: "How many refills are remaining?" (Note: The label on the medicine or pill bottle will show how many refills are remaining. If there are no refills remaining, then a renewal may be needed.)      3. EXPIRATION DATE: "What is the expiration date?" (Note: The label states when the prescription will expire, and thus can no longer be refilled.)      4. PRESCRIBING HCP: "Who prescribed it?" Reason: If prescribed by specialist, call should be referred to that group.      5. SYMPTOMS: "Do you have any symptoms?"      6. PREGNANCY: "Is there any chance that you are pregnant?" "When was your last menstrual period?"  Protocols used: Medication Refill and Renewal Call-A-AH

## 2022-03-02 NOTE — Patient Instructions (Signed)

## 2022-03-02 NOTE — Telephone Encounter (Signed)
At the request of Dr Margarita Rana, I met with the patient when he was in the clinic today. He is interested in applying for disability. He his father has suggested he apply.   I explained that he can apply on-line and he can call [employer] with questions.  I also explained that I can make a referral to [facility] to assist with the application process.  I explained that there is no guarantee that they will be able to assist but they may have more information about his eligibility. He was in agreement to placing the referral and provided the required authorizations.  He is not in danger of homelessness.  I instructed him to call me with questions or if he has not heard from [facility] in a week.  I sent the referral to [facility] via secure email.

## 2022-03-02 NOTE — Progress Notes (Signed)
Wants to discuss disability. Chronic Pain.

## 2022-03-03 MED ORDER — FLUTICASONE PROPIONATE 50 MCG/ACT NA SUSP: 2.0000 | Freq: Every day | NASAL | 2 refills | Status: DC

## 2022-03-24 ENCOUNTER — Ambulatory Visit: Payer: Medicaid Other | Admitting: Family Medicine

## 2022-04-26 ENCOUNTER — Ambulatory Visit: Payer: Medicaid Other | Attending: Family Medicine

## 2022-04-26 DIAGNOSIS — M25562 Pain in left knee: Secondary | ICD-10-CM | POA: Diagnosis present

## 2022-04-26 DIAGNOSIS — G8929 Other chronic pain: Secondary | ICD-10-CM | POA: Diagnosis present

## 2022-04-26 DIAGNOSIS — M5459 Other low back pain: Secondary | ICD-10-CM | POA: Diagnosis present

## 2022-04-26 DIAGNOSIS — M25551 Pain in right hip: Secondary | ICD-10-CM | POA: Insufficient documentation

## 2022-04-26 DIAGNOSIS — M1990 Unspecified osteoarthritis, unspecified site: Secondary | ICD-10-CM | POA: Diagnosis not present

## 2022-04-26 DIAGNOSIS — M25552 Pain in left hip: Secondary | ICD-10-CM | POA: Insufficient documentation

## 2022-04-26 DIAGNOSIS — M5442 Lumbago with sciatica, left side: Secondary | ICD-10-CM | POA: Diagnosis present

## 2022-04-26 DIAGNOSIS — M5441 Lumbago with sciatica, right side: Secondary | ICD-10-CM | POA: Diagnosis present

## 2022-04-26 DIAGNOSIS — M25561 Pain in right knee: Secondary | ICD-10-CM | POA: Insufficient documentation
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
## 2022-05-03 ENCOUNTER — Ambulatory Visit: Payer: Medicaid Other

## 2022-05-05 ENCOUNTER — Ambulatory Visit: Payer: Medicaid Other

## 2022-05-09 ENCOUNTER — Ambulatory Visit: Payer: Medicaid Other

## 2022-05-09 DIAGNOSIS — M25551 Pain in right hip: Secondary | ICD-10-CM

## 2022-05-09 DIAGNOSIS — M5459 Other low back pain: Secondary | ICD-10-CM | POA: Diagnosis not present

## 2022-05-09 DIAGNOSIS — M25552 Pain in left hip: Secondary | ICD-10-CM

## 2022-05-09 DIAGNOSIS — G8929 Other chronic pain: Secondary | ICD-10-CM
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
## 2022-05-11 ENCOUNTER — Telehealth: Payer: Self-pay

## 2022-05-11 ENCOUNTER — Ambulatory Visit: Payer: Medicaid Other

## 2022-05-11 NOTE — Telephone Encounter (Signed)
No show. Called patient and left a message pertaining to appointment and a reminder for the next follow up session. Return phone call requested. Phone number ([phone]) provided.   

## 2022-05-16 ENCOUNTER — Ambulatory Visit: Payer: Medicaid Other

## 2022-05-18 ENCOUNTER — Ambulatory Visit: Payer: Medicaid Other

## 2022-05-18 ENCOUNTER — Telehealth: Payer: Self-pay

## 2022-05-18 NOTE — Telephone Encounter (Signed)
No show. Called pt who said that he was confused about the schedule. Pt. then talked to the receptionist pertaining to the schedule.

## 2022-05-23 ENCOUNTER — Ambulatory Visit: Payer: Medicaid Other

## 2022-05-25 ENCOUNTER — Ambulatory Visit: Payer: Medicaid Other | Attending: Family Medicine

## 2022-05-25 DIAGNOSIS — M25551 Pain in right hip: Secondary | ICD-10-CM | POA: Diagnosis present

## 2022-05-25 DIAGNOSIS — M25552 Pain in left hip: Secondary | ICD-10-CM | POA: Insufficient documentation

## 2022-05-25 DIAGNOSIS — M5442 Lumbago with sciatica, left side: Secondary | ICD-10-CM | POA: Diagnosis present

## 2022-05-25 DIAGNOSIS — M25562 Pain in left knee: Secondary | ICD-10-CM | POA: Insufficient documentation

## 2022-05-25 DIAGNOSIS — M5459 Other low back pain: Secondary | ICD-10-CM | POA: Insufficient documentation

## 2022-05-25 DIAGNOSIS — M25561 Pain in right knee: Secondary | ICD-10-CM | POA: Insufficient documentation

## 2022-05-25 DIAGNOSIS — M5441 Lumbago with sciatica, right side: Secondary | ICD-10-CM | POA: Diagnosis present

## 2022-05-25 DIAGNOSIS — G8929 Other chronic pain: Secondary | ICD-10-CM | POA: Insufficient documentation
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
## 2022-06-01 ENCOUNTER — Ambulatory Visit: Payer: Medicaid Other

## 2022-06-08 ENCOUNTER — Ambulatory Visit: Payer: Medicaid Other

## 2022-07-19 ENCOUNTER — Other Ambulatory Visit: Payer: Self-pay | Admitting: Family Medicine

## 2022-07-19 DIAGNOSIS — F411 Generalized anxiety disorder: Secondary | ICD-10-CM

## 2022-08-03 ENCOUNTER — Other Ambulatory Visit: Payer: Self-pay | Admitting: Family Medicine

## 2022-08-03 NOTE — Telephone Encounter (Signed)
Requested medication (s) are due for refill today: Yes  Requested medication (s) are on the active medication list: Yes  Last refill:  03/03/22  Future visit scheduled: Yes  Notes to clinic:  Pharmacy requests 90 day supply.    Requested Prescriptions  Pending Prescriptions Disp Refills   fluticasone (FLONASE) 50 MCG/ACT nasal spray [Pharmacy Med Name: FLUTICASONE PROP 50 MCG SPRAY] 48 mL 1    Sig: SPRAY 2 SPRAYS INTO EACH NOSTRIL EVERY DAY     Ear, Nose, and Throat: Nasal Preparations - Corticosteroids Passed - 08/03/2022 11:31 AM      Passed - Valid encounter within last 12 months    Recent Outpatient Visits           5 months ago GAD (generalized anxiety disorder)   [place], Charlane Ferretti, MD   1 year ago [place], [place]   1 year ago Other insomnia   [place], Charlane Ferretti, MD   2 years ago [place], Charlane Ferretti, MD   2 years ago [place], [place]       Future Appointments             In 1 month Charlott Rakes, MD [place]

## 2022-08-17 ENCOUNTER — Telehealth: Payer: Self-pay | Admitting: Family Medicine

## 2022-08-17 DIAGNOSIS — Z8042 Family history of malignant neoplasm of prostate: Secondary | ICD-10-CM

## 2022-08-17 DIAGNOSIS — Z889 Allergy status to unspecified drugs, medicaments and biological substances status: Secondary | ICD-10-CM

## 2022-08-17 NOTE — Telephone Encounter (Signed)
Copied from [place] [id number]. Topic: Referral - Request for Referral >> Aug 17, 2022  3:56 PM Erskine Squibb wrote: Has patient seen PCP for this complaint? No. Referral for which specialty: Allergies and Prostate check and health Preferred provider/office: [facility] for prostate screening and health. These are preferred locations but also whatever his provider thinks is best Reason for referral: Allergy testing  especially foods and outside stuff for quality of life for the Allergist and history of prostate cancer in his family for the Urologist.  Please assist patient further

## 2022-08-17 NOTE — Telephone Encounter (Signed)
Routing to PCP for review.

## 2022-08-18 NOTE — Telephone Encounter (Signed)
Pt has been informed of referral being placed.

## 2022-08-18 NOTE — Telephone Encounter (Signed)
Done

## 2022-08-18 NOTE — Addendum Note (Signed)
Addended by: Charlott Rakes on: 08/18/2022 12:56 PM   Modules accepted: Orders

## 2022-08-31 ENCOUNTER — Ambulatory Visit: Payer: Self-pay | Admitting: *Deleted

## 2022-08-31 ENCOUNTER — Emergency Department (HOSPITAL_COMMUNITY)
Admission: EM | Admit: 2022-08-31 | Discharge: 2022-08-31 | Disposition: A | Discharge status: Home / Self Care | Payer: Medicaid Other | Attending: Emergency Medicine | Admitting: Emergency Medicine

## 2022-08-31 ENCOUNTER — Encounter (HOSPITAL_COMMUNITY): Payer: Self-pay | Admitting: Emergency Medicine

## 2022-08-31 ENCOUNTER — Other Ambulatory Visit: Payer: Self-pay

## 2022-08-31 DIAGNOSIS — F410 Panic disorder [episodic paroxysmal anxiety] without agoraphobia: Secondary | ICD-10-CM

## 2022-08-31 DIAGNOSIS — F419 Anxiety disorder, unspecified: Secondary | ICD-10-CM

## 2022-08-31 LAB — BASIC METABOLIC PANEL
Anion gap: 12 (ref 5–15)
BUN: 10 mg/dL (ref 6–20)
CO2: 20 mmol/L — ABNORMAL LOW (ref 22–32)
Calcium: 8.8 mg/dL — ABNORMAL LOW (ref 8.9–10.3)
Chloride: 104 mmol/L (ref 98–111)
Creatinine, Ser: 0.84 mg/dL (ref 0.61–1.24)
GFR, Estimated: >60 mL/min (ref >60)
Glucose, Bld: 103 mg/dL — ABNORMAL HIGH (ref 70–99)
Potassium: 3.3 mmol/L — ABNORMAL LOW (ref 3.5–5.1)
Sodium: 136 mmol/L (ref 135–145)

## 2022-08-31 LAB — CBC
HCT: 42.9 % (ref 39.0–52.0)
Hemoglobin: 15.1 g/dL (ref 13.0–17.0)
MCH: 30.9 pg (ref 26.0–34.0)
MCHC: 35.2 g/dL (ref 30.0–36.0)
MCV: 87.9 fL (ref 80.0–100.0)
Platelets: 345 10*3/uL (ref 150–400)
RBC: 4.88 MIL/uL (ref 4.22–5.81)
RDW: 12.8 % (ref 11.5–15.5)
WBC: 9.3 10*3/uL (ref 4.0–10.5)
nRBC: 0 % (ref 0.0–0.2)

## 2022-08-31 LAB — CK: Total CK: 175 U/L (ref 49–397)

## 2022-08-31 MED ORDER — DIAZEPAM 5 MG PO TABS: 5.0000 mg | ORAL_TABLET | Freq: Two times a day (BID) | ORAL | 0 refills | Status: DC | PRN

## 2022-08-31 MED ORDER — SODIUM CHLORIDE 0.9 % IV SOLN: 1000.0000 mL | INTRAVENOUS | Status: DC

## 2022-08-31 MED ORDER — DIAZEPAM 5 MG/ML IJ SOLN
10.0000 mg | Freq: Once | INTRAMUSCULAR | Status: AC
Start: 2022-08-31 — End: 2022-08-31
  Administered 2022-08-31: 10 mg via INTRAVENOUS
  Filled 2022-08-31: qty 2

## 2022-08-31 MED ORDER — SODIUM CHLORIDE 0.9 % IV BOLUS (SEPSIS)
1000.0000 mL | Freq: Once | INTRAVENOUS | Status: AC
  Administered 2022-08-31: 1000 mL via INTRAVENOUS

## 2022-08-31 MED ORDER — KETOROLAC TROMETHAMINE 30 MG/ML IJ SOLN
30.0000 mg | Freq: Once | INTRAMUSCULAR | Status: AC
Start: 2022-08-31 — End: 2022-08-31
  Administered 2022-08-31: 30 mg via INTRAVENOUS
  Filled 2022-08-31: qty 1

## 2022-08-31 MED ORDER — DIAZEPAM 5 MG PO TABS
5.0000 mg | ORAL_TABLET | Freq: Two times a day (BID) | ORAL | 0 refills | Status: AC | PRN
Start: 2022-08-31 — End: ?

## 2022-08-31 NOTE — Discharge Instructions (Signed)
Follow-up with your mental health provider.  Return to the ED as needed for worsening or recurrent symptoms

## 2022-08-31 NOTE — Telephone Encounter (Signed)
  Chief Complaint: Panic Attack Symptoms: Tearful, "Need meds to get me out of this." Frequency: 4am this morning Pertinent Negatives: Patient denies  Disposition: '[x]'$ ED /'[]'$ Urgent Care (no appt availability in office) / '[]'$ Appointment(In office/virtual)/ '[]'$  [place] [facility]/ '[]'$ Home Care/ '[]'$ Refused Recommended Disposition /'[]'$ [place] [facility]/ '[]'$  Follow-up with PCP Additional Notes: Pt crying, states "Been this way since 4 am." Repeats "I need help, need medication to get me out of this." Crying, periods of angry affect." Told pt I was calling EMS,  shouting he will not go to ED to sit there for hours, just need medication "I need immediate help NOW.'  States medication given to him is not working.  Again advised NT would call EMS, pt hung up. Sounds very distressed. Attempted to reach wife at number provided in demographics, left generic message to call. Called EMS for wellness check.  States they will send an Garment/textile technologist to location.  Pt called during practice lunch hour. Reason for Disposition  Patient sounds very sick or weak to the triager  Answer Assessment - Initial Assessment Questions 1. CONCERN: "Did anything happen that prompted you to call today?"      Woke up 2. ANXIETY SYMPTOMS: "Can you describe how you (your loved one; patient) have been feeling?" (e.g., tense, restless, panicky, anxious, keyed up, overwhelmed, sense of impending doom).       3. ONSET: "How long have you been feeling this way?" (e.g., hours, days, weeks)     4 am 4. SEVERITY: "How would you rate the level of anxiety?" (e.g., 0 - 10; or mild, moderate, severe).      5. FUNCTIONAL IMPAIRMENT: "How have these feelings affected your ability to do daily activities?" "Have you had more difficulty than usual doing your normal daily activities?" (e.g., getting better, same, worse; self-care, school, work, interactions)      6. HISTORY: "Have you felt this way before?" "Have you ever been diagnosed with an  anxiety problem in the past?" (e.g., generalized anxiety disorder, panic attacks, PTSD). If Yes, ask: "How was this problem treated?" (e.g., medicines, counseling, etc.)     *No Answer* 7. RISK OF HARM - SUICIDAL IDEATION: "Do you ever have thoughts of hurting or killing yourself?" If Yes, ask:  "Do you have these feelings now?" "Do you have a plan on how you would do this?"     *No Answer* 8. TREATMENT:  "What has been done so far to treat this anxiety?" (e.g., medicines, relaxation strategies). "What has helped?"     *No Answer* 9. TREATMENT - THERAPIST: "Do you have a counselor or therapist? Name?"     No 10. POTENTIAL TRIGGERS: "Do you drink caffeinated beverages (e.g., coffee, colas, teas), and how much daily?" "Do you drink alcohol or use any drugs?" "Have you started any new medicines recently?"       *No Answer* 11. PATIENT SUPPORT: "Who is with you now?" "Who do you live with?" "Do you have family or friends who you can talk to?"        Alone 12. OTHER SYMPTOMS: "Do you have any other symptoms?" (e.g., feeling depressed, trouble concentrating, trouble sleeping, trouble breathing, palpitations or fast heartbeat, chest pain, sweating, nausea, or diarrhea)  Protocols used: Anxiety and Panic Attack-A-AH

## 2022-08-31 NOTE — Telephone Encounter (Signed)
Please see previous triage today and patient's wife requesting PCP recommendations regarding future care for patient.   Reason for Disposition  [1] Caller requesting NON-URGENT health information AND [2] PCP's office is the best resource  Answer Assessment - Initial Assessment Questions 1. REASON FOR CALL or QUESTION: "What is your reason for calling today?" or "How can I best help you?" or "What question do you have that I can help answer?"     Patient's wife on DPR calling to notify PCP patient taken to ED by police for panic attack and requesting advise for future care  Protocols used: Information Only Call - No Triage-A-AH

## 2022-08-31 NOTE — Telephone Encounter (Signed)
Patient's wife on DPR called to return call and would like to notify PCP that patient had panic attack severe enough for police to be notified and took patient to ED. Patient's wife very concerned and has not heard from ED regarding patient status. Please advise .

## 2022-08-31 NOTE — ED Notes (Signed)
Patient refuses final set of vitals.

## 2022-08-31 NOTE — ED Provider Notes (Signed)
[place] EMERGENCY DEPARTMENT AT [facility] Provider Note   CSN: [id number] Arrival date & time: 08/31/22  1449     History  Chief Complaint  Patient presents with   Anxiety   Generalized Body Aches    Chad Johnson is a 42 y.o. male.  Patient presents to the ER with complaints of severe panic attack.  Patient states he has history of anxiety.  He has been taking his medications but they do not work.  Patient states he has been feeling anxious and hyperventilating since this morning about 4 AM.  Patient states he feels like his limbs have been contracting intense because of his hyperventilation.  He is hungry and has not eaten anything today.  He was given 5 mg of Versed by EMS which she does not feel like helped.  He denies any alcohol or drug use.  No recent fevers or chills.  No vomiting or diarrhea.    Home Medications Prior to Admission medications   Medication Sig Start Date End Date Taking? Authorizing Provider  diazepam (VALIUM) 5 MG tablet Take 1 tablet (5 mg total) by mouth every 12 (twelve) hours as needed for anxiety (panic attack). 08/31/22  Yes Dorie Rank, MD  aspirin EC 81 MG tablet Take 1 tablet (81 mg total) by mouth daily. Patient not taking: Reported on 03/02/2022 01/10/18   Rowe Clack, MD  cetirizine (ZYRTEC) 10 MG chewable tablet Chew 10 mg by mouth daily.    [provider]  DULoxetine (CYMBALTA) 60 MG capsule TAKE 1 CAPSULE (60 MG TOTAL) BY MOUTH DAILY. FOR CHRONIC PAIN 07/20/22   Charlott Rakes, MD  fluticasone (FLONASE) 50 MCG/ACT nasal spray SPRAY 2 SPRAYS INTO EACH NOSTRIL EVERY DAY 08/03/22   Charlott Rakes, MD  HYDROcodone-acetaminophen (NORCO/VICODIN) 5-325 MG tablet Take 2 tablets by mouth every 4 (four) hours as needed. Patient not taking: Reported on 03/02/2022 12/09/21   Marney Setting, NP  hydrOXYzine (VISTARIL) 25 MG capsule Take 1 capsule (25 mg total) by mouth every 8 (eight) hours as needed. 02/15/22   Brunetta Jeans, PA-C  lidocaine (XYLOCAINE) 5 % ointment Apply 1 application. topically as needed. Patient not taking: Reported on 03/02/2022 12/09/21   Marney Setting, NP  pramoxine (PROCTOFOAM) 1 % foam Place 1 application. rectally 3 (three) times daily as needed for anal itching. Patient not taking: Reported on 03/02/2022 12/09/21   Marney Setting, NP      Allergies    Patient has no known allergies.    Review of Systems   Review of Systems  Physical Exam Updated Vital Signs BP (!) 143/90   Pulse 88   Temp 98.1 F (36.7 C) (Oral)   Resp 16   SpO2 100%  Physical Exam Vitals and nursing note reviewed.  Constitutional:      Appearance: He is well-developed. He is ill-appearing.  HENT:     Head: Normocephalic and atraumatic.     Right Ear: External ear normal.     Left Ear: External ear normal.  Eyes:     General: No scleral icterus.       Right eye: No discharge.        Left eye: No discharge.     Conjunctiva/sclera: Conjunctivae normal.  Neck:     Trachea: No tracheal deviation.  Cardiovascular:     Rate and Rhythm: Normal rate and regular rhythm.  Pulmonary:     Effort: Pulmonary effort is normal. No respiratory distress.  Breath sounds: Normal breath sounds. No stridor. No wheezing or rales.  Abdominal:     General: Bowel sounds are normal. There is no distension.     Palpations: Abdomen is soft.     Tenderness: There is no abdominal tenderness. There is no guarding or rebound.  Musculoskeletal:        General: No swelling, tenderness or deformity.     Cervical back: Neck supple.     Right lower leg: No edema.     Left lower leg: No edema.  Skin:    General: Skin is warm and dry.     Findings: No rash.  Neurological:     General: No focal deficit present.     Mental Status: He is alert.     Cranial Nerves: No cranial nerve deficit, dysarthria or facial asymmetry.     Sensory: No sensory deficit.     Motor: No abnormal muscle tone or seizure activity.      Coordination: Coordination normal.  Psychiatric:        Mood and Affect: Mood normal.     ED Results / Procedures / Treatments   Labs (all labs ordered are listed, but only abnormal results are displayed) Labs Reviewed  BASIC METABOLIC PANEL - Abnormal; Notable for the following components:      Result Value   Potassium 3.3 (*)    CO2 20 (*)    Glucose, Bld 103 (*)    Calcium 8.8 (*)    All other components within normal limits  CBC  CK  RAPID URINE DRUG SCREEN, HOSP PERFORMED    EKG None  Radiology No results found.  Procedures Procedures    Medications Ordered in ED Medications  sodium chloride 0.9 % bolus 1,000 mL (1,000 mLs Intravenous New Bag/Given 08/31/22 1608)    Followed by  0.9 %  sodium chloride infusion (has no administration in time range)  diazepam (VALIUM) injection 10 mg (10 mg Intravenous Given 08/31/22 1608)  ketorolac (TORADOL) 30 MG/ML injection 30 mg (30 mg Intravenous Given 08/31/22 1620)    ED Course/ Medical Decision Making/ A&P Clinical Course as of 08/31/22 1754  Wed Aug 31, 2022  1696 CBC metabolic panel unremarkable.  CK normal [JK]  1750 Patient is feeling much better after the dose of Valium. [JK]    Clinical Course User Index [JK] Dorie Rank, MD                             Medical Decision Making Problems Addressed: Anxiety: acute illness or injury that poses a threat to life or bodily functions Panic attack: acute illness or injury that poses a threat to life or bodily functions  Amount and/or Complexity of Data Reviewed Labs: ordered. Decision-making details documented in ED Course.  Risk Prescription drug management.   Patient presented to the ED with complaints of a severe panic attack.  Patient states he has been anxious all day long.  He was having muscle spasms related to his hyperventilation.  Patient denied any suicidal or homicidal ideation.  He was not having any chest pain fevers or other complaints.  ED workup did  not show any signs of rhabdomyolysis.  No signs of any severe electrolyte abnormality or anemia.  Patient was given a dose of Toradol and Valium.  He felt significantly better.  Will give a few tablets of Valium.  Note the electronic prescription was not working properly.  I did give  a paper prescription        Final Clinical Impression(s) / ED Diagnoses Final diagnoses:  Anxiety  Panic attack    Rx / DC Orders ED Discharge Orders          Ordered    diazepam (VALIUM) 5 MG tablet  Every 12 hours PRN        08/31/22 1753              Dorie Rank, MD 08/31/22 1754

## 2022-08-31 NOTE — ED Triage Notes (Signed)
Pt arrives via EMS from home. Pt reported that he began having a severe panic attack that started around 0400 this morning. Upon ems arrival to patient's home, patient was hyperventilating, causing his limbs to contract. EMS administered '5mg'$  of IM versed. Pt arrives AxOx4.
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
## 2022-09-06 ENCOUNTER — Ambulatory Visit: Payer: Medicaid Other | Admitting: Family Medicine

## 2022-09-06 ENCOUNTER — Ambulatory Visit: Payer: Medicaid Other | Admitting: Allergy

## 2022-09-12 ENCOUNTER — Encounter: Payer: Self-pay | Admitting: Urology

## 2022-09-12 ENCOUNTER — Ambulatory Visit: Payer: Medicaid Other | Admitting: Urology

## 2022-09-12 VITALS — BP 128/77 | HR 59 | Ht 74.0 in | Wt 165.0 lb

## 2022-09-12 DIAGNOSIS — Z8042 Family history of malignant neoplasm of prostate: Secondary | ICD-10-CM | POA: Diagnosis not present
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
## 2022-09-13 ENCOUNTER — Encounter: Payer: Self-pay | Admitting: Urology

## 2022-09-13 ENCOUNTER — Encounter: Payer: Self-pay | Admitting: Allergy

## 2022-09-13 ENCOUNTER — Ambulatory Visit: Payer: Medicaid Other | Admitting: Allergy

## 2022-09-13 VITALS — BP 130/86 | HR 75 | Temp 98.2°F | Resp 16 | Ht 73.0 in | Wt 169.0 lb

## 2022-09-13 DIAGNOSIS — R0602 Shortness of breath: Secondary | ICD-10-CM

## 2022-09-13 DIAGNOSIS — T781XXA Other adverse food reactions, not elsewhere classified, initial encounter: Secondary | ICD-10-CM | POA: Diagnosis not present

## 2022-09-13 DIAGNOSIS — Z872 Personal history of diseases of the skin and subcutaneous tissue: Secondary | ICD-10-CM

## 2022-09-13 DIAGNOSIS — T7819XD Other adverse food reactions, not elsewhere classified, subsequent encounter: Secondary | ICD-10-CM

## 2022-09-13 DIAGNOSIS — K219 Gastro-esophageal reflux disease without esophagitis: Secondary | ICD-10-CM

## 2022-09-13 DIAGNOSIS — R0789 Other chest pain: Secondary | ICD-10-CM

## 2022-09-13 DIAGNOSIS — J3089 Other allergic rhinitis: Secondary | ICD-10-CM | POA: Diagnosis not present

## 2022-09-13 DIAGNOSIS — R634 Abnormal weight loss: Secondary | ICD-10-CM

## 2022-09-13 DIAGNOSIS — H1010 Acute atopic conjunctivitis, unspecified eye: Secondary | ICD-10-CM | POA: Insufficient documentation

## 2022-09-13 DIAGNOSIS — T781XXD Other adverse food reactions, not elsewhere classified, subsequent encounter: Secondary | ICD-10-CM

## 2022-09-13 DIAGNOSIS — H1013 Acute atopic conjunctivitis, bilateral: Secondary | ICD-10-CM

## 2022-09-13 DIAGNOSIS — Z713 Dietary counseling and surveillance: Secondary | ICD-10-CM | POA: Diagnosis not present

## 2022-09-13 LAB — PSA: Prostate Specific Ag, Serum: 0.8 ng/mL (ref 0.0–4.0)

## 2022-09-13 MED ORDER — AZELASTINE HCL 0.1 % NA SOLN: 1.0000 | Freq: Two times a day (BID) | NASAL | 5 refills | Status: AC | PRN

## 2022-09-13 MED ORDER — LEVOCETIRIZINE DIHYDROCHLORIDE 5 MG PO TABS: 5.0000 mg | ORAL_TABLET | Freq: Every evening | ORAL | 5 refills | Status: AC

## 2022-09-13 MED ORDER — ALBUTEROL SULFATE HFA 108 (90 BASE) MCG/ACT IN AERS: 2.0000 | INHALATION_SPRAY | RESPIRATORY_TRACT | 1 refills | Status: AC | PRN

## 2022-09-13 NOTE — Assessment & Plan Note (Signed)
Concerned about food allergies as he had various GI issues. Saw GI in the past years ago - no colonoscopy/EGD. Records not available for review. Some GERD symptoms but not on medications for this. Diet changed once he got married and having less symptoms. Chocolate seems to be a trigger. Some weight loss due to decreased appetite. Today's skin prick testing was negative to common foods including chocolate.  Discussed with patient that skin prick testing and bloodwork (food IgE levels) check for IgE mediated reactions which his clinical presentation does not support.  Keep a food journal with symptoms and foods eaten. If you notice any specific foods that seem to worsen symptoms then let us know. Avoid foods that are reflux triggers including chocolate. See handout for lifestyle and dietary modifications. Please follow up with PCP regarding weight loss. May need to see GI again as well.

## 2022-09-13 NOTE — Assessment & Plan Note (Signed)
Skin is doing much better with no recent topical steroid cream use.  Continue proper skin care. Monitor symptoms.

## 2022-09-13 NOTE — Assessment & Plan Note (Signed)
Perennial rhinoconjunctivitis symptoms for many years.  Tried Zyrtec, Claritin and Flonase with some benefit.  No prior allergy evaluation.  2 dogs at home. Today's skin testing showed: Positive to cockroach, ragweed, weed, trees, mold, cat, dog, dust mites.  Start environmental control measures as below. Use over the counter antihistamines such as Zyrtec (cetirizine), Claritin (loratadine), Allegra (fexofenadine), or Xyzal (levocetirizine) daily as needed. May take twice a day during allergy flares. May switch antihistamines every few months. Use Flonase (fluticasone) nasal spray 1 spray per nostril twice a day as needed for nasal congestion.  Use azelastine nasal spray 1-2 sprays per nostril twice a day as needed for runny nose/drainage. Nasal saline spray (i.e., Simply Saline) or nasal saline lavage (i.e., NeilMed) is recommended as needed and prior to medicated nasal sprays. If no improvement, refer to ENT next to look at sinus anatomy.  Consider allergy injections for long term control if above medications do not help the symptoms - handout given.

## 2022-09-13 NOTE — Assessment & Plan Note (Signed)
See assessment and plan as above. Declined eye drops.
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
## 2022-09-13 NOTE — Assessment & Plan Note (Signed)
Episodes of shortness of breath with no triggers. No prior asthma diagnosis. Used family member's albuterol for this in the past with some benefit. Today's spirometry was unremarkable given effort with no improvement in FEV1 post bronchodilator treatment. Clinically feeling unchanged.  Keep track of symptoms.  May use albuterol rescue inhaler 2 puffs every 4 to 6 hours as needed for shortness of breath, chest tightness, coughing, and wheezing. Monitor frequency of use.

## 2022-09-13 NOTE — Patient Instructions (Addendum)
Today's skin testing showed: Positive to cockroach, ragweed, weed, trees, mold, cat, dog, dust mites.  Negative to select foods including chocolate.  Results given.  Environmental allergies Start environmental control measures as below. Use over the counter antihistamines such as Zyrtec (cetirizine), Claritin (loratadine), Allegra (fexofenadine), or Xyzal (levocetirizine) daily as needed. May take twice a day during allergy flares. May switch antihistamines every few months. Use Flonase (fluticasone) nasal spray 1 spray per nostril twice a day as needed for nasal congestion.  Use azelastine nasal spray 1-2 sprays per nostril twice a day as needed for runny nose/drainage. Nasal saline spray (i.e., Simply Saline) or nasal saline lavage (i.e., NeilMed) is recommended as needed and prior to medicated nasal sprays. If no improvement, refer to ENT next to look at sinus anatomy.  Consider allergy injections for long term control if above medications do not help the symptoms - handout given.   Foods Discussed with patient that skin prick testing and bloodwork (food IgE levels) check for IgE mediated reactions which his clinical presentation does not support.  Keep a food journal with symptoms and foods eaten. If you notice any specific foods that seem to worsen symptoms then let us know. Avoid foods that are reflux triggers including chocolate.  Heartburn: See handout for lifestyle and dietary modifications.  Weight loss/GI issues Please follow up with your PCP regarding this.  May want to see GI again.   Breathing Your breathing test did not improve after the treatment. Keep track of your symptoms. May use albuterol rescue inhaler 2 puffs every 4 to 6 hours as needed for shortness of breath, chest tightness, coughing, and wheezing. Monitor frequency of use.   Follow up in 2 months or sooner if needed.    Reducing Pollen Exposure Pollen seasons: trees (spring), grass (summer) and  ragweed/weeds (fall). Keep windows closed in your home and car to lower pollen exposure.  Install air conditioning in the bedroom and throughout the house if possible.  Avoid going out in dry windy days - especially early morning. Pollen counts are highest between 5 - 10 AM and on dry, hot and windy days.  Save outside activities for late afternoon or after a heavy rain, when pollen levels are lower.  Avoid mowing of grass if you have grass pollen allergy. Be aware that pollen can also be transported indoors on people and pets.  Dry your clothes in an automatic dryer rather than hanging them outside where they might collect pollen.  Rinse hair and eyes before bedtime. Mold Control Mold and fungi can grow on a variety of surfaces provided certain temperature and moisture conditions exist.  Outdoor molds grow on plants, decaying vegetation and soil. The major outdoor mold, Alternaria and Cladosporium, are found in very high numbers during hot and dry conditions. Generally, a late summer - fall peak is seen for common outdoor fungal spores. Rain will temporarily lower outdoor mold spore count, but counts rise rapidly when the rainy period ends. The most important indoor molds are Aspergillus and Penicillium. Dark, humid and poorly ventilated basements are ideal sites for mold growth. The next most common sites of mold growth are the bathroom and the kitchen. Outdoor (Seasonal) Mold Control Use air conditioning and keep windows closed. Avoid exposure to decaying vegetation. Avoid leaf raking. Avoid grain handling. Consider wearing a face mask if working in moldy areas.  Indoor (Perennial) Mold Control  Maintain humidity below 50%. Get rid of mold growth on hard surfaces with water, detergent and, if  necessary, 5% bleach (do not mix with other cleaners). Then dry the area completely. If mold covers an area more than 10 square feet, consider hiring an indoor environmental professional. For clothing,  washing with soap and water is best. If moldy items cannot be cleaned and dried, throw them away. Remove sources e.g. contaminated carpets. Repair and seal leaking roofs or pipes. Using dehumidifiers in damp basements may be helpful, but empty the water and clean units regularly to prevent mildew from forming. All rooms, especially basements, bathrooms and kitchens, require ventilation and cleaning to deter mold and mildew growth. Avoid carpeting on concrete or damp floors, and storing items in damp areas. Control of House Dust Mite Allergen Dust mite allergens are a common trigger of allergy and asthma symptoms. While they can be found throughout the house, these microscopic creatures thrive in warm, humid environments such as bedding, upholstered furniture and carpeting. Because so much time is spent in the bedroom, it is essential to reduce mite levels there.  Encase pillows, mattresses, and box springs in special allergen-proof fabric covers or airtight, zippered plastic covers.  Bedding should be washed weekly in hot water (130 F) and dried in a hot dryer. Allergen-proof covers are available for comforters and pillows that can't be regularly washed.  Wash the allergy-proof covers every few months. Minimize clutter in the bedroom. Keep pets out of the bedroom.  Keep humidity less than 50% by using a dehumidifier or air conditioning. You can buy a humidity measuring device called a hygrometer to monitor this.  If possible, replace carpets with hardwood, linoleum, or washable area rugs. If that's not possible, vacuum frequently with a vacuum that has a HEPA filter. Remove all upholstered furniture and non-washable window drapes from the bedroom. Remove all non-washable stuffed toys from the bedroom.  Wash stuffed toys weekly. Pet Allergen Avoidance: Contrary to popular opinion, there are no "hypoallergenic" breeds of dogs or cats. That is because people are not allergic to an animal's hair, but to  an allergen found in the animal's saliva, dander (dead skin flakes) or urine. Pet allergy symptoms typically occur within minutes. For some people, symptoms can build up and become most severe 8 to 12 hours after contact with the animal. People with severe allergies can experience reactions in public places if dander has been transported on the pet owners' clothing. Keeping an animal outdoors is only a partial solution, since homes with pets in the yard still have higher concentrations of animal allergens. Before getting a pet, ask your allergist to determine if you are allergic to animals. If your pet is already considered part of your family, try to minimize contact and keep the pet out of the bedroom and other rooms where you spend a great deal of time. As with dust mites, vacuum carpets often or replace carpet with a hardwood floor, tile or linoleum. High-efficiency particulate air (HEPA) cleaners can reduce allergen levels over time. While dander and saliva are the source of cat and dog allergens, urine is the source of allergens from rabbits, hamsters, mice and [place] pigs; so ask a non-allergic family member to clean the animal's cage. If you have a pet allergy, talk to your allergist about the potential for allergy immunotherapy (allergy shots). This strategy can often provide long-term relief. Cockroach Allergen Avoidance Cockroaches are often found in the homes of densely populated urban areas, schools or commercial buildings, but these creatures can lurk almost anywhere. This does not mean that you have a dirty house  or living area. Block all areas where roaches can enter the home. This includes crevices, wall cracks and windows.  Cockroaches need water to survive, so fix and seal all leaky faucets and pipes. Have an exterminator go through the house when your family and pets are gone to eliminate any remaining roaches. Keep food in lidded containers and put pet food dishes away after your pets  are done eating. Vacuum and sweep the floor after meals, and take out garbage and recyclables. Use lidded garbage containers in the kitchen. Wash dishes immediately after use and clean under stoves, refrigerators or toasters where crumbs can accumulate. Wipe off the stove and other kitchen surfaces and cupboards regularly.

## 2022-09-13 NOTE — Assessment & Plan Note (Signed)
>>  ASSESSMENT AND PLAN FOR OTHER ALLERGIC RHINITIS WRITTEN ON 09/13/2022  3:40 PM BY Chad Sia, DO  Perennial rhinoconjunctivitis symptoms for many years.  Tried Zyrtec, Claritin and Flonase with some benefit.  No prior allergy evaluation.  2 dogs at home. Today's skin testing showed: Positive to cockroach, ragweed, weed, trees, mold, cat, dog, dust mites.  Start environmental control measures as below. Use over the counter antihistamines such as Zyrtec (cetirizine), Claritin (loratadine), Allegra (fexofenadine), or Xyzal (levocetirizine) daily as needed. May take twice a day during allergy flares. May switch antihistamines every few months. Use Flonase (fluticasone) nasal spray 1 spray per nostril twice a day as needed for nasal congestion.  Use azelastine nasal spray 1-2 sprays per nostril twice a day as needed for runny nose/drainage. Nasal saline spray (i.e., Simply Saline) or nasal saline lavage (i.e., NeilMed) is recommended as needed and prior to medicated nasal sprays. If no improvement, refer to ENT next to look at sinus anatomy.  Consider allergy injections for long term control if above medications do not help the symptoms - handout given.

## 2022-09-14 ENCOUNTER — Encounter: Payer: Self-pay | Admitting: *Deleted

## 2022-10-18 ENCOUNTER — Other Ambulatory Visit: Payer: Self-pay | Admitting: Family Medicine

## 2022-11-10 ENCOUNTER — Ambulatory Visit: Payer: Medicaid Other | Admitting: Allergy

## 2022-11-10 NOTE — Progress Notes (Deleted)
Follow Up Note  RE: Chad Johnson MRN: [id number] DOB: [birth year] Date of Office Visit: 11/10/2022  Referring provider: Hoy Register, MD Primary care provider: Hoy Register, MD  Chief Complaint: No chief complaint on file.  History of Present Illness: I had the pleasure of seeing [name] for a follow up visit at the [facility] on 11/10/2022. He is a 42 y.o. male, who is being followed for allergic rhinoconjunctivitis, shortness of breath, history of eczema. His previous allergy office visit was on 09/13/2022 with Dr. Selena Batten. Today is a regular follow up visit.  Other allergic rhinitis Perennial rhinoconjunctivitis symptoms for many years.  Tried Zyrtec, Claritin and Flonase with some benefit.  No prior allergy evaluation.  2 dogs at home. Today's skin testing showed: Positive to cockroach, ragweed, weed, trees, mold, cat, dog, dust mites.  Start environmental control measures as below. Use over the counter antihistamines such as Zyrtec (cetirizine), Claritin (loratadine), Allegra (fexofenadine), or Xyzal (levocetirizine) daily as needed. May take twice a day during allergy flares. May switch antihistamines every few months. Use Flonase (fluticasone) nasal spray 1 spray per nostril twice a day as needed for nasal congestion.  Use azelastine nasal spray 1-2 sprays per nostril twice a day as needed for runny nose/drainage. Nasal saline spray (i.e., Simply Saline) or nasal saline lavage (i.e., NeilMed) is recommended as needed and prior to medicated nasal sprays. If no improvement, refer to ENT next to look at sinus anatomy.  Consider allergy injections for long term control if above medications do not help the symptoms - handout given.    Allergic conjunctivitis of both eyes See assessment and plan as above. Declined eye drops.   Dietary counseling and surveillance Concerned about food allergies as he had various GI issues. Saw GI in the past years ago -  no colonoscopy/EGD. Records not available for review. Some GERD symptoms but not on medications for this. Diet changed once he got married and having less symptoms. Chocolate seems to be a trigger. Some weight loss due to decreased appetite. Today's skin prick testing was negative to common foods including chocolate.  Discussed with patient that skin prick testing and bloodwork (food IgE levels) check for IgE mediated reactions which his clinical presentation does not support.  Keep a food journal with symptoms and foods eaten. If you notice any specific foods that seem to worsen symptoms then let us know. Avoid foods that are reflux triggers including chocolate. See handout for lifestyle and dietary modifications. Please follow up with PCP regarding weight loss. May need to see GI again as well.   Shortness of breath Episodes of shortness of breath with no triggers. No prior asthma diagnosis. Used family member's albuterol for this in the past with some benefit. Today's spirometry was unremarkable given effort with no improvement in FEV1 post bronchodilator treatment. Clinically feeling unchanged.  Keep track of symptoms.  May use albuterol rescue inhaler 2 puffs every 4 to 6 hours as needed for shortness of breath, chest tightness, coughing, and wheezing. Monitor frequency of use.    History of eczema Skin is doing much better with no recent topical steroid cream use.  Continue proper skin care. Monitor symptoms.    Return in about 2 months (around 11/12/2022).  Assessment and Plan: Chad Johnson is a 42 y.o. male with: No problem-specific Assessment & Plan notes found for this encounter.  No follow-ups on file.  No orders of the defined types were placed in this encounter.  Lab Orders  No laboratory test(s) ordered today    Diagnostics: Spirometry:  Tracings reviewed. His effort: {Blank single:19197::"Good reproducible efforts.","It was hard to get consistent efforts and there is a  question as to whether this reflects a maximal maneuver.","Poor effort, data can not be interpreted."} FVC: ***L FEV1: ***L, ***% predicted FEV1/FVC ratio: ***% Interpretation: {Blank single:19197::"Spirometry consistent with mild obstructive disease","Spirometry consistent with moderate obstructive disease","Spirometry consistent with severe obstructive disease","Spirometry consistent with possible restrictive disease","Spirometry consistent with mixed obstructive and restrictive disease","Spirometry uninterpretable due to technique","Spirometry consistent with normal pattern","No overt abnormalities noted given today's efforts"}.  Please see scanned spirometry results for details.  Skin Testing: {Blank single:19197::"Select foods","Environmental allergy panel","Environmental allergy panel and select foods","Food allergy panel","None","Deferred due to recent antihistamines use"}. *** Results discussed with patient/family.   Medication List:  Current Outpatient Medications  Medication Sig Dispense Refill   albuterol (VENTOLIN HFA) 108 (90 Base) MCG/ACT inhaler Inhale 2 puffs into the lungs every 4 (four) hours as needed for wheezing or shortness of breath (coughing fits). 18 g 1   azelastine (ASTELIN) 0.1 % nasal spray Place 1-2 sprays into both nostrils 2 (two) times daily as needed (nasal drainage). Use in each nostril as directed 30 mL 5   diazepam (VALIUM) 5 MG tablet Take 1 tablet (5 mg total) by mouth every 12 (twelve) hours as needed for anxiety (panic attack). 4 tablet 0   fluticasone (FLONASE) 50 MCG/ACT nasal spray SPRAY 2 SPRAYS INTO EACH NOSTRIL EVERY DAY 16 g 0   levocetirizine (XYZAL) 5 MG tablet Take 1 tablet (5 mg total) by mouth every evening. 30 tablet 5   No current facility-administered medications for this visit.   Allergies: No Known Allergies I reviewed his past medical history, social history, family history, and environmental history and no significant changes have  been reported from his previous visit.  Review of Systems  Constitutional:  Positive for appetite change and unexpected weight change. Negative for chills and fever.  HENT:  Positive for congestion, postnasal drip, rhinorrhea and sneezing.   Eyes:  Negative for itching.  Respiratory:  Positive for shortness of breath. Negative for cough, chest tightness and wheezing.   Cardiovascular:  Negative for chest pain.  Gastrointestinal:  Negative for abdominal pain.  Genitourinary:  Negative for difficulty urinating.  Skin:  Negative for rash.  Allergic/Immunologic: Positive for environmental allergies.  Neurological:  Positive for headaches.    Objective: There were no vitals taken for this visit. There is no height or weight on file to calculate BMI. Physical Exam Vitals and nursing note reviewed.  Constitutional:      Appearance: Normal appearance. He is well-developed.  HENT:     Head: Normocephalic and atraumatic.     Right Ear: Tympanic membrane and external ear normal.     Left Ear: Tympanic membrane and external ear normal.     Nose: Nose normal.     Mouth/Throat:     Mouth: Mucous membranes are moist.     Pharynx: Oropharynx is clear.  Eyes:     Conjunctiva/sclera: Conjunctivae normal.  Cardiovascular:     Rate and Rhythm: Normal rate and regular rhythm.     Heart sounds: Normal heart sounds. No murmur heard.    No friction rub. No gallop.  Pulmonary:     Effort: Pulmonary effort is normal.     Breath sounds: Normal breath sounds. No wheezing, rhonchi or rales.  Musculoskeletal:     Cervical back: Neck supple.  Skin:  General: Skin is warm.     Findings: No rash.  Neurological:     Mental Status: He is alert and oriented to person, place, and time.    Previous notes and tests were reviewed. The plan was reviewed with the patient/family, and all questions/concerned were addressed.  It was my pleasure to see Boone today and participate in his care. Please feel  free to contact me with any questions or concerns.  Sincerely,  Wyline Mood, DO Allergy & Immunology  [facility] office: [phone] [facility] office: [phone]

## 2022-11-14 NOTE — Progress Notes (Unsigned)
Follow Up Note  RE: Chad Johnson MRN: [id number] DOB: 1981-07-16 Date of Office Visit: 11/15/2022  Referring provider: Hoy Register, MD Primary care provider: Hoy Register, MD  Chief Complaint: No chief complaint on file.  History of Present Illness: I had the pleasure of seeing Chad Johnson for a follow up visit at the [facility] on 11/14/2022. He is a 42 y.o. male, who is being followed for allergic rhinoconjunctivitis, shortness of breath, history of eczema. His previous allergy office visit was on 09/13/2022 with Dr. Selena Batten. Today is a regular follow up visit.  Other allergic rhinitis Perennial rhinoconjunctivitis symptoms for many years.  Tried Zyrtec, Claritin and Flonase with some benefit.  No prior allergy evaluation.  2 dogs at home. Today's skin testing showed: Positive to cockroach, ragweed, weed, trees, mold, cat, dog, dust mites.  Start environmental control measures as below. Use over the counter antihistamines such as Zyrtec (cetirizine), Claritin (loratadine), Allegra (fexofenadine), or Xyzal (levocetirizine) daily as needed. May take twice a day during allergy flares. May switch antihistamines every few months. Use Flonase (fluticasone) nasal spray 1 spray per nostril twice a day as needed for nasal congestion.  Use azelastine nasal spray 1-2 sprays per nostril twice a day as needed for runny nose/drainage. Nasal saline spray (i.e., Simply Saline) or nasal saline lavage (i.e., NeilMed) is recommended as needed and prior to medicated nasal sprays. If no improvement, refer to ENT next to look at sinus anatomy.  Consider allergy injections for long term control if above medications do not help the symptoms - handout given.    Allergic conjunctivitis of both eyes See assessment and plan as above. Declined eye drops.   Dietary counseling and surveillance Concerned about food allergies as he had various GI issues. Saw GI in the past years ago -  no colonoscopy/EGD. Records not available for review. Some GERD symptoms but not on medications for this. Diet changed once he got married and having less symptoms. Chocolate seems to be a trigger. Some weight loss due to decreased appetite. Today's skin prick testing was negative to common foods including chocolate.  Discussed with patient that skin prick testing and bloodwork (food IgE levels) check for IgE mediated reactions which his clinical presentation does not support.  Keep a food journal with symptoms and foods eaten. If you notice any specific foods that seem to worsen symptoms then let us know. Avoid foods that are reflux triggers including chocolate. See handout for lifestyle and dietary modifications. Please follow up with PCP regarding weight loss. May need to see GI again as well.   Shortness of breath Episodes of shortness of breath with no triggers. No prior asthma diagnosis. Used family member's albuterol for this in the past with some benefit. Today's spirometry was unremarkable given effort with no improvement in FEV1 post bronchodilator treatment. Clinically feeling unchanged.  Keep track of symptoms.  May use albuterol rescue inhaler 2 puffs every 4 to 6 hours as needed for shortness of breath, chest tightness, coughing, and wheezing. Monitor frequency of use.    History of eczema Skin is doing much better with no recent topical steroid cream use.  Continue proper skin care. Monitor symptoms.    Return in about 2 months (around 11/12/2022).  Assessment and Plan: Azir is a 42 y.o. male with: No problem-specific Assessment & Plan notes found for this encounter.  No follow-ups on file.  No orders of the defined types were placed in this encounter.  Lab Orders  No laboratory test(s) ordered today    Diagnostics: Spirometry:  Tracings reviewed. His effort: {Blank single:19197::"Good reproducible efforts.","It was hard to get consistent efforts and there is a  question as to whether this reflects a maximal maneuver.","Poor effort, data can not be interpreted."} FVC: ***L FEV1: ***L, ***% predicted FEV1/FVC ratio: ***% Interpretation: {Blank single:19197::"Spirometry consistent with mild obstructive disease","Spirometry consistent with moderate obstructive disease","Spirometry consistent with severe obstructive disease","Spirometry consistent with possible restrictive disease","Spirometry consistent with mixed obstructive and restrictive disease","Spirometry uninterpretable due to technique","Spirometry consistent with normal pattern","No overt abnormalities noted given today's efforts"}.  Please see scanned spirometry results for details.  Skin Testing: {Blank single:19197::"Select foods","Environmental allergy panel","Environmental allergy panel and select foods","Food allergy panel","None","Deferred due to recent antihistamines use"}. *** Results discussed with patient/family.   Medication List:  Current Outpatient Medications  Medication Sig Dispense Refill   albuterol (VENTOLIN HFA) 108 (90 Base) MCG/ACT inhaler Inhale 2 puffs into the lungs every 4 (four) hours as needed for wheezing or shortness of breath (coughing fits). 18 g 1   azelastine (ASTELIN) 0.1 % nasal spray Place 1-2 sprays into both nostrils 2 (two) times daily as needed (nasal drainage). Use in each nostril as directed 30 mL 5   diazepam (VALIUM) 5 MG tablet Take 1 tablet (5 mg total) by mouth every 12 (twelve) hours as needed for anxiety (panic attack). 4 tablet 0   fluticasone (FLONASE) 50 MCG/ACT nasal spray SPRAY 2 SPRAYS INTO EACH NOSTRIL EVERY DAY 16 g 0   levocetirizine (XYZAL) 5 MG tablet Take 1 tablet (5 mg total) by mouth every evening. 30 tablet 5   No current facility-administered medications for this visit.   Allergies: No Known Allergies I reviewed his past medical history, social history, family history, and environmental history and no significant changes have  been reported from his previous visit.  Review of Systems  Constitutional:  Positive for appetite change and unexpected weight change. Negative for chills and fever.  HENT:  Positive for congestion, postnasal drip, rhinorrhea and sneezing.   Eyes:  Negative for itching.  Respiratory:  Positive for shortness of breath. Negative for cough, chest tightness and wheezing.   Cardiovascular:  Negative for chest pain.  Gastrointestinal:  Negative for abdominal pain.  Genitourinary:  Negative for difficulty urinating.  Skin:  Negative for rash.  Allergic/Immunologic: Positive for environmental allergies.  Neurological:  Positive for headaches.    Objective: There were no vitals taken for this visit. There is no height or weight on file to calculate BMI. Physical Exam Vitals and nursing note reviewed.  Constitutional:      Appearance: Normal appearance. He is well-developed.  HENT:     Head: Normocephalic and atraumatic.     Right Ear: Tympanic membrane and external ear normal.     Left Ear: Tympanic membrane and external ear normal.     Nose: Nose normal.     Mouth/Throat:     Mouth: Mucous membranes are moist.     Pharynx: Oropharynx is clear.  Eyes:     Conjunctiva/sclera: Conjunctivae normal.  Cardiovascular:     Rate and Rhythm: Normal rate and regular rhythm.     Heart sounds: Normal heart sounds. No murmur heard.    No friction rub. No gallop.  Pulmonary:     Effort: Pulmonary effort is normal.     Breath sounds: Normal breath sounds. No wheezing, rhonchi or rales.  Musculoskeletal:     Cervical back: Neck supple.  Skin:  General: Skin is warm.     Findings: No rash.  Neurological:     Mental Status: He is alert and oriented to person, place, and time.    Previous notes and tests were reviewed. The plan was reviewed with the patient/family, and all questions/concerned were addressed.  It was my pleasure to see Chad Johnson today and participate in his care. Please feel  free to contact me with any questions or concerns.  Sincerely,  Wyline Mood, DO Allergy & Immunology  [facility] office: [phone] [facility] office: [phone]

## 2022-11-15 ENCOUNTER — Encounter: Payer: Self-pay | Admitting: Allergy

## 2022-11-15 ENCOUNTER — Other Ambulatory Visit: Payer: Self-pay

## 2022-11-15 ENCOUNTER — Ambulatory Visit: Payer: Medicaid Other | Admitting: Allergy

## 2022-11-15 VITALS — BP 122/70 | HR 70 | Temp 98.0°F | Resp 20 | Ht 74.0 in | Wt 168.0 lb

## 2022-11-15 DIAGNOSIS — J3089 Other allergic rhinitis: Secondary | ICD-10-CM

## 2022-11-15 DIAGNOSIS — Z872 Personal history of diseases of the skin and subcutaneous tissue: Secondary | ICD-10-CM

## 2022-11-15 DIAGNOSIS — R0602 Shortness of breath: Secondary | ICD-10-CM | POA: Diagnosis not present

## 2022-11-15 DIAGNOSIS — J302 Other seasonal allergic rhinitis: Secondary | ICD-10-CM | POA: Diagnosis not present

## 2022-11-15 DIAGNOSIS — Z713 Dietary counseling and surveillance: Secondary | ICD-10-CM

## 2022-11-15 DIAGNOSIS — H1010 Acute atopic conjunctivitis, unspecified eye: Secondary | ICD-10-CM

## 2022-11-15 DIAGNOSIS — K219 Gastro-esophageal reflux disease without esophagitis: Secondary | ICD-10-CM

## 2022-11-15 DIAGNOSIS — H1013 Acute atopic conjunctivitis, bilateral: Secondary | ICD-10-CM | POA: Diagnosis not present

## 2022-11-15 MED ORDER — BUDESONIDE-FORMOTEROL FUMARATE 80-4.5 MCG/ACT IN AERO: 2.0000 | INHALATION_SPRAY | Freq: Two times a day (BID) | RESPIRATORY_TRACT | 2 refills | Status: AC

## 2022-11-15 MED ORDER — METHYLPREDNISOLONE ACETATE 40 MG/ML IJ SUSP
40.0000 mg | Freq: Once | INTRAMUSCULAR | Status: AC
  Administered 2022-11-15: 40 mg via INTRAMUSCULAR

## 2022-11-15 NOTE — Assessment & Plan Note (Signed)
Not feeling well since he had URI 1 month ago - noted trouble breathing and not sure if albuterol is helping. Only took prednisone for 2 days as it upset his stomach. Denies fevers/chills.  Today's spirometry was not interpretable due to poor effort and was not repeated as patient was not feeling well.  Depo  IM given today to help with allergies and questionable RAD/asthma triggered by recent infection.  Daily controller medication(s): start Symbicort 2 puffs twice a day with spacer and rinse mouth afterwards. May use albuterol rescue inhaler 2 puffs or nebulizer every 4 to 6 hours as needed for shortness of breath, chest tightness, coughing, and wheezing. May use albuterol rescue inhaler 2 puffs 5 to 15 minutes prior to strenuous physical activities. Monitor frequency of use.  Get spirometry at next visit. If no improvement - will need further work up including CXR, bloodwork and r/o cardiac issues.

## 2022-11-15 NOTE — Patient Instructions (Signed)
Breathing Steroid shot given today. Daily controller medication(s): start Symbicort 2 puffs twice a day with spacer and rinse mouth afterwards. May use albuterol rescue inhaler 2 puffs or nebulizer every 4 to 6 hours as needed for shortness of breath, chest tightness, coughing, and wheezing. May use albuterol rescue inhaler 2 puffs 5 to 15 minutes prior to strenuous physical activities. Monitor frequency of use.  Breathing control goals:  Full participation in all desired activities (may need albuterol before activity) Albuterol use two times or less a week on average (not counting use with activity) Cough interfering with sleep two times or less a month Oral steroids no more than once a year No hospitalizations   Environmental allergies 2024 skin testing showed: Positive to cockroach, ragweed, weed, trees, mold, cat, dog, dust mites. Continue environmental control measures as below. Use over the counter antihistamines such as Zyrtec (cetirizine), Claritin (loratadine), Allegra (fexofenadine), or Xyzal (levocetirizine) daily as needed. May take twice a day during allergy flares. May switch antihistamines every few months. Use Flonase (fluticasone) nasal spray 1 spray per nostril twice a day as needed for nasal congestion.  Use azelastine nasal spray 1-2 sprays per nostril twice a day as needed for runny nose/drainage. Nasal saline spray (i.e., Simply Saline) or nasal saline lavage (i.e., NeilMed) is recommended as needed and prior to medicated nasal sprays. Consider allergy injections for long term control if above medications do not help the symptoms.  Foods Avoid foods that are reflux triggers including chocolate.  Heartburn: Continue lifestyle and dietary modifications.  Weight loss/GI issues Please follow up with your PCP regarding this.  May want to see GI again.   Follow up in 1 months or sooner if needed.    Reducing Pollen Exposure Pollen seasons: trees (spring), grass  (summer) and ragweed/weeds (fall). Keep windows closed in your home and car to lower pollen exposure.  Install air conditioning in the bedroom and throughout the house if possible.  Avoid going out in dry windy days - especially early morning. Pollen counts are highest between 5 - 10 AM and on dry, hot and windy days.  Save outside activities for late afternoon or after a heavy rain, when pollen levels are lower.  Avoid mowing of grass if you have grass pollen allergy. Be aware that pollen can also be transported indoors on people and pets.  Dry your clothes in an automatic dryer rather than hanging them outside where they might collect pollen.  Rinse hair and eyes before bedtime. Mold Control Mold and fungi can grow on a variety of surfaces provided certain temperature and moisture conditions exist.  Outdoor molds grow on plants, decaying vegetation and soil. The major outdoor mold, Alternaria and Cladosporium, are found in very high numbers during hot and dry conditions. Generally, a late summer - fall peak is seen for common outdoor fungal spores. Rain will temporarily lower outdoor mold spore count, but counts rise rapidly when the rainy period ends. The most important indoor molds are Aspergillus and Penicillium. Dark, humid and poorly ventilated basements are ideal sites for mold growth. The next most common sites of mold growth are the bathroom and the kitchen. Outdoor (Seasonal) Mold Control Use air conditioning and keep windows closed. Avoid exposure to decaying vegetation. Avoid leaf raking. Avoid grain handling. Consider wearing a face mask if working in moldy areas.  Indoor (Perennial) Mold Control  Maintain humidity below 50%. Get rid of mold growth on hard surfaces with water, detergent and, if necessary, 5% bleach (  do not mix with other cleaners). Then dry the area completely. If mold covers an area more than 10 square feet, consider hiring an indoor environmental  professional. For clothing, washing with soap and water is best. If moldy items cannot be cleaned and dried, throw them away. Remove sources e.g. contaminated carpets. Repair and seal leaking roofs or pipes. Using dehumidifiers in damp basements may be helpful, but empty the water and clean units regularly to prevent mildew from forming. All rooms, especially basements, bathrooms and kitchens, require ventilation and cleaning to deter mold and mildew growth. Avoid carpeting on concrete or damp floors, and storing items in damp areas. Control of House Dust Mite Allergen Dust mite allergens are a common trigger of allergy and asthma symptoms. While they can be found throughout the house, these microscopic creatures thrive in warm, humid environments such as bedding, upholstered furniture and carpeting. Because so much time is spent in the bedroom, it is essential to reduce mite levels there.  Encase pillows, mattresses, and box springs in special allergen-proof fabric covers or airtight, zippered plastic covers.  Bedding should be washed weekly in hot water (130 F) and dried in a hot dryer. Allergen-proof covers are available for comforters and pillows that can't be regularly washed.  Wash the allergy-proof covers every few months. Minimize clutter in the bedroom. Keep pets out of the bedroom.  Keep humidity less than 50% by using a dehumidifier or air conditioning. You can buy a humidity measuring device called a hygrometer to monitor this.  If possible, replace carpets with hardwood, linoleum, or washable area rugs. If that's not possible, vacuum frequently with a vacuum that has a HEPA filter. Remove all upholstered furniture and non-washable window drapes from the bedroom. Remove all non-washable stuffed toys from the bedroom.  Wash stuffed toys weekly. Pet Allergen Avoidance: Contrary to popular opinion, there are no "hypoallergenic" breeds of dogs or cats. That is because people are not allergic  to an animal's hair, but to an allergen found in the animal's saliva, dander (dead skin flakes) or urine. Pet allergy symptoms typically occur within minutes. For some people, symptoms can build up and become most severe 8 to 12 hours after contact with the animal. People with severe allergies can experience reactions in public places if dander has been transported on the pet owners' clothing. Keeping an animal outdoors is only a partial solution, since homes with pets in the yard still have higher concentrations of animal allergens. Before getting a pet, ask your allergist to determine if you are allergic to animals. If your pet is already considered part of your family, try to minimize contact and keep the pet out of the bedroom and other rooms where you spend a great deal of time. As with dust mites, vacuum carpets often or replace carpet with a hardwood floor, tile or linoleum. High-efficiency particulate air (HEPA) cleaners can reduce allergen levels over time. While dander and saliva are the source of cat and dog allergens, urine is the source of allergens from rabbits, hamsters, mice and [place] pigs; so ask a non-allergic family member to clean the animal's cage. If you have a pet allergy, talk to your allergist about the potential for allergy immunotherapy (allergy shots). This strategy can often provide long-term relief. Cockroach Allergen Avoidance Cockroaches are often found in the homes of densely populated urban areas, schools or commercial buildings, but these creatures can lurk almost anywhere. This does not mean that you have a dirty house or living area.  Block all areas where roaches can enter the home. This includes crevices, wall cracks and windows.  Cockroaches need water to survive, so fix and seal all leaky faucets and pipes. Have an exterminator go through the house when your family and pets are gone to eliminate any remaining roaches. Keep food in lidded containers and put pet food  dishes away after your pets are done eating. Vacuum and sweep the floor after meals, and take out garbage and recyclables. Use lidded garbage containers in the kitchen. Wash dishes immediately after use and clean under stoves, refrigerators or toasters where crumbs can accumulate. Wipe off the stove and other kitchen surfaces and cupboards regularly.

## 2022-11-15 NOTE — Assessment & Plan Note (Signed)
Past history - Perennial rhinoconjunctivitis symptoms for many years.  Tried Zyrtec, Claritin and Flonase with some benefit. 2 dogs at home. 2024 skin testing showed: Positive to cockroach, ragweed, weed, trees, mold, cat, dog, dust mites.  Interim history - allergies flared with nasal congestion.  Start environmental control measures as below. Use over the counter antihistamines such as Zyrtec (cetirizine), Claritin (loratadine), Allegra (fexofenadine), or Xyzal (levocetirizine) daily as needed. May take twice a day during allergy flares. May switch antihistamines every few months. Use Flonase (fluticasone) nasal spray 1 spray per nostril twice a day as needed for nasal congestion.  Use azelastine nasal spray 1-2 sprays per nostril twice a day as needed for runny nose/drainage. Nasal saline spray (i.e., Simply Saline) or nasal saline lavage (i.e., NeilMed) is recommended as needed and prior to medicated nasal sprays. If no improvement, refer to ENT next to look at sinus anatomy.  Consider allergy injections for long term control if above medications do not help the symptoms - handout given.

## 2022-11-16 ENCOUNTER — Other Ambulatory Visit: Payer: Self-pay | Admitting: Family Medicine

## 2022-11-16 NOTE — Assessment & Plan Note (Signed)
   Continue lifestyle and dietary modifications.  

## 2022-11-16 NOTE — Assessment & Plan Note (Signed)
Past history - concerned about food allergies as he had various GI issues. Saw GI in the past years ago - no colonoscopy/EGD. Records not available for review. Some GERD symptoms but not on medications for this. Diet changed once he got married and having less symptoms. Chocolate seems to be a trigger. Some weight loss due to decreased appetite. 2024 skin prick testing was negative to common foods including chocolate.  Interim history - no additional weight loss.  Keep a food journal with symptoms and foods eaten. Avoid foods that are reflux triggers including chocolate. Please follow up with PCP regarding weight loss. May need to see GI again as well.

## 2022-12-12 NOTE — Progress Notes (Deleted)
Follow Up Note  RE: Chad Johnson MRN: [id number] DOB: 02-15-1981 Date of Office Visit: 12/13/2022  Referring provider: Hoy Register, MD Primary care provider: Hoy Register, MD  Chief Complaint: No chief complaint on file.  History of Present Illness: I had the pleasure of seeing Chad Johnson for a follow up visit at the [facility] on 12/12/2022. He is a 42 y.o. male, who is being followed for shortness of breath, allergic rhinoconjunctivitis, GERD. His previous allergy office visit was on 11/15/2022 with Dr. Selena Batten. Today is a regular follow up visit.  Shortness of breath Not feeling well since he had URI 1 month ago - noted trouble breathing and not sure if albuterol is helping. Only took prednisone for 2 days as it upset his stomach. Denies fevers/chills.  Today's spirometry was not interpretable due to poor effort and was not repeated as patient was not feeling well.  Depo 40mg  IM given today to help with allergies and questionable RAD/asthma triggered by recent infection.  Daily controller medication(s): start Symbicort 2 puffs twice a day with spacer and rinse mouth afterwards. May use albuterol rescue inhaler 2 puffs or nebulizer every 4 to 6 hours as needed for shortness of breath, chest tightness, coughing, and wheezing. May use albuterol rescue inhaler 2 puffs 5 to 15 minutes prior to strenuous physical activities. Monitor frequency of use.  Get spirometry at next visit. If no improvement - will need further work up including CXR, bloodwork and r/o cardiac issues.    Seasonal and perennial allergic rhinoconjunctivitis Past history - Perennial rhinoconjunctivitis symptoms for many years.  Tried Zyrtec, Claritin and Flonase with some benefit. 2 dogs at home. 2024 skin testing showed: Positive to cockroach, ragweed, weed, trees, mold, cat, dog, dust mites.  Interim history - allergies flared with nasal congestion.  Continue environmental control  measures as below. Use over the counter antihistamines such as Zyrtec (cetirizine), Claritin (loratadine), Allegra (fexofenadine), or Xyzal (levocetirizine) daily as needed. May take twice a day during allergy flares. May switch antihistamines every few months. Use Flonase (fluticasone) nasal spray 1 spray per nostril twice a day as needed for nasal congestion.  Use azelastine nasal spray 1-2 sprays per nostril twice a day as needed for runny nose/drainage. Nasal saline spray (i.e., Simply Saline) or nasal saline lavage (i.e., NeilMed) is recommended as needed and prior to medicated nasal sprays. Consider allergy injections for long term control if above medications do not help the symptoms.   Gastroesophageal reflux disease Continue lifestyle and dietary modifications.   Dietary counseling and surveillance Past history - concerned about food allergies as he had various GI issues. Saw GI in the past years ago - no colonoscopy/EGD. Records not available for review. Some GERD symptoms but not on medications for this. Diet changed once he got married and having less symptoms. Chocolate seems to be a trigger. Some weight loss due to decreased appetite. 2024 skin prick testing was negative to common foods including chocolate.  Interim history - no additional weight loss.  Keep a food journal with symptoms and foods eaten. Avoid foods that are reflux triggers including chocolate. Please follow up with PCP regarding weight loss. May need to see GI again as well.  Assessment and Plan: Chad Johnson is a 42 y.o. male with: No problem-specific Assessment & Plan notes found for this encounter.  No follow-ups on file.  No orders of the defined types were placed in this encounter.  Lab Orders  No laboratory  test(s) ordered today    Diagnostics: Spirometry:  Tracings reviewed. His effort: {Blank single:19197::"Good reproducible efforts.","It was hard to get consistent efforts and there is a question as  to whether this reflects a maximal maneuver.","Poor effort, data can not be interpreted."} FVC: ***L FEV1: ***L, ***% predicted FEV1/FVC ratio: ***% Interpretation: {Blank single:19197::"Spirometry consistent with mild obstructive disease","Spirometry consistent with moderate obstructive disease","Spirometry consistent with severe obstructive disease","Spirometry consistent with possible restrictive disease","Spirometry consistent with mixed obstructive and restrictive disease","Spirometry uninterpretable due to technique","Spirometry consistent with normal pattern","No overt abnormalities noted given today's efforts"}.  Please see scanned spirometry results for details.  Skin Testing: {Blank single:19197::"Select foods","Environmental allergy panel","Environmental allergy panel and select foods","Food allergy panel","None","Deferred due to recent antihistamines use"}. *** Results discussed with patient/family.   Medication List:  Current Outpatient Medications  Medication Sig Dispense Refill   albuterol (VENTOLIN HFA) 108 (90 Base) MCG/ACT inhaler Inhale 2 puffs into the lungs every 4 (four) hours as needed for wheezing or shortness of breath (coughing fits). 18 g 1   azelastine (ASTELIN) 0.1 % nasal spray Place 1-2 sprays into both nostrils 2 (two) times daily as needed (nasal drainage). Use in each nostril as directed (Patient not taking: Reported on 11/15/2022) 30 mL 5   budesonide-formoterol (SYMBICORT) 80-4.5 MCG/ACT inhaler Inhale 2 puffs into the lungs in the morning and at bedtime. with spacer and rinse mouth afterwards. 1 each 2   diazepam (VALIUM) 5 MG tablet Take 1 tablet (5 mg total) by mouth every 12 (twelve) hours as needed for anxiety (panic attack). 4 tablet 0   fluticasone (FLONASE) 50 MCG/ACT nasal spray SPRAY 2 SPRAYS INTO EACH NOSTRIL EVERY DAY 16 g 0   levocetirizine (XYZAL) 5 MG tablet Take 1 tablet (5 mg total) by mouth every evening. (Patient not taking: Reported on  11/15/2022) 30 tablet 5   traZODone (DESYREL) 50 MG tablet Take 50 mg by mouth at bedtime.     No current facility-administered medications for this visit.   Allergies: No Known Allergies I reviewed his past medical history, social history, family history, and environmental history and no significant changes have been reported from his previous visit.  Review of Systems  Constitutional:  Positive for fatigue. Negative for appetite change, chills, fever and unexpected weight change.  HENT:  Positive for congestion, postnasal drip and rhinorrhea.   Eyes:  Negative for itching.  Respiratory:  Positive for shortness of breath. Negative for cough, chest tightness and wheezing.   Cardiovascular:  Negative for chest pain.  Gastrointestinal:  Negative for abdominal pain.  Genitourinary:  Negative for difficulty urinating.  Skin:  Negative for rash.  Allergic/Immunologic: Positive for environmental allergies.  Neurological:  Positive for headaches.    Objective: There were no vitals taken for this visit. There is no height or weight on file to calculate BMI. Physical Exam Vitals and nursing note reviewed.  Constitutional:      Appearance: Normal appearance. He is well-developed.  HENT:     Head: Normocephalic and atraumatic.     Right Ear: Tympanic membrane and external ear normal.     Left Ear: Tympanic membrane and external ear normal.     Nose: Nose normal.     Mouth/Throat:     Mouth: Mucous membranes are moist.     Pharynx: Oropharynx is clear.  Eyes:     Conjunctiva/sclera: Conjunctivae normal.  Cardiovascular:     Rate and Rhythm: Normal rate and regular rhythm.     Heart sounds: Normal heart sounds. No  murmur heard.    No friction rub. No gallop.  Pulmonary:     Effort: Pulmonary effort is normal.     Breath sounds: Normal breath sounds. No wheezing, rhonchi or rales.  Musculoskeletal:     Cervical back: Neck supple.  Skin:    General: Skin is warm.     Findings: No  rash.  Neurological:     Mental Status: He is alert and oriented to person, place, and time.    Previous notes and tests were reviewed. The plan was reviewed with the patient/family, and all questions/concerned were addressed.  It was my pleasure to see Jerrad today and participate in his care. Please feel free to contact me with any questions or concerns.  Sincerely,  Wyline Mood, DO Allergy & Immunology  [facility] office: [phone] [facility] office: [phone]

## 2022-12-13 ENCOUNTER — Ambulatory Visit: Payer: Medicaid Other | Admitting: Allergy

## 2022-12-13 DIAGNOSIS — J302 Other seasonal allergic rhinitis: Secondary | ICD-10-CM

## 2022-12-13 DIAGNOSIS — K219 Gastro-esophageal reflux disease without esophagitis: Secondary | ICD-10-CM

## 2022-12-13 DIAGNOSIS — R0602 Shortness of breath: Secondary | ICD-10-CM

## 2023-02-16 ENCOUNTER — Other Ambulatory Visit: Payer: Self-pay | Admitting: Physician Assistant

## 2023-02-16 DIAGNOSIS — G8929 Other chronic pain: Secondary | ICD-10-CM

## 2023-02-16 DIAGNOSIS — R229 Localized swelling, mass and lump, unspecified: Secondary | ICD-10-CM

## 2023-02-21 ENCOUNTER — Ambulatory Visit
Admission: RE | Admit: 2023-02-21 | Discharge: 2023-02-21 | Disposition: A | Discharge status: Home / Self Care | Payer: Medicaid Other | Source: Ambulatory Visit | Attending: Physician Assistant | Admitting: Physician Assistant

## 2023-02-21 ENCOUNTER — Ambulatory Visit
Admission: RE | Admit: 2023-02-21 | Discharge: 2023-02-21 | Disposition: A | Discharge status: Home / Self Care | Payer: Medicaid Other | Attending: Physician Assistant | Admitting: Physician Assistant

## 2023-02-21 DIAGNOSIS — M5441 Lumbago with sciatica, right side: Secondary | ICD-10-CM | POA: Diagnosis present

## 2023-02-21 DIAGNOSIS — G8929 Other chronic pain: Secondary | ICD-10-CM

## 2023-02-21 DIAGNOSIS — M5442 Lumbago with sciatica, left side: Secondary | ICD-10-CM | POA: Insufficient documentation

## 2023-02-21 DIAGNOSIS — R229 Localized swelling, mass and lump, unspecified: Secondary | ICD-10-CM

## 2023-07-24 ENCOUNTER — Telehealth: Payer: Self-pay | Admitting: *Deleted

## 2023-07-24 NOTE — Telephone Encounter (Signed)
Patient called requesting CT scan. Patient states he had an episode when trying to pick up his daughter of severe back pain. He states he fell to the floor and was unable to move for a short time. EMS was called and patient declined to go to the ER for evaluation. He states he called his primary Dr and they only offered him a muscle relaxer. He states he doesn't want medication. 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 Advised patient I would speak to one of our surgeons to see if they think a CT is appropriate. I advised patient if he develops any chest pain, SOB or increasing back pain to go to the ER. Patient verbalized understanding. He is also concerned with elevated BP which I let him know to follow up with his primary Dr.

## 2023-07-25 ENCOUNTER — Emergency Department (HOSPITAL_COMMUNITY): Payer: Medicaid Other

## 2023-07-25 ENCOUNTER — Emergency Department (HOSPITAL_COMMUNITY)
Admission: EM | Admit: 2023-07-25 | Discharge: 2023-07-25 | Disposition: A | Discharge status: Home / Self Care | Payer: Medicaid Other | Attending: Emergency Medicine | Admitting: Emergency Medicine

## 2023-07-25 ENCOUNTER — Other Ambulatory Visit: Payer: Self-pay

## 2023-07-25 ENCOUNTER — Encounter (HOSPITAL_COMMUNITY): Payer: Self-pay

## 2023-07-25 DIAGNOSIS — I71019 Dissection of thoracic aorta, unspecified: Secondary | ICD-10-CM | POA: Diagnosis not present

## 2023-07-25 DIAGNOSIS — M549 Dorsalgia, unspecified: Secondary | ICD-10-CM

## 2023-07-25 DIAGNOSIS — I10 Essential (primary) hypertension: Secondary | ICD-10-CM | POA: Insufficient documentation

## 2023-07-25 DIAGNOSIS — M546 Pain in thoracic spine: Secondary | ICD-10-CM | POA: Insufficient documentation

## 2023-07-25 LAB — CBC
HCT: 41 % (ref 39.0–52.0)
Hemoglobin: 14.7 g/dL (ref 13.0–17.0)
MCH: 31.2 pg (ref 26.0–34.0)
MCHC: 35.9 g/dL (ref 30.0–36.0)
MCV: 87 fL (ref 80.0–100.0)
Platelets: 329 10*3/uL (ref 150–400)
RBC: 4.71 MIL/uL (ref 4.22–5.81)
RDW: 12.9 % (ref 11.5–15.5)
WBC: 10.5 10*3/uL (ref 4.0–10.5)
nRBC: 0 % (ref 0.0–0.2)

## 2023-07-25 LAB — I-STAT CHEM 8, ED
BUN: 12 mg/dL (ref 6–20)
Calcium, Ion: 1.1 mmol/L — ABNORMAL LOW (ref 1.15–1.40)
Chloride: 109 mmol/L (ref 98–111)
Creatinine, Ser: 0.8 mg/dL (ref 0.61–1.24)
Glucose, Bld: 97 mg/dL (ref 70–99)
HCT: 43 % (ref 39.0–52.0)
Hemoglobin: 14.6 g/dL (ref 13.0–17.0)
Potassium: 4 mmol/L (ref 3.5–5.1)
Sodium: 141 mmol/L (ref 135–145)
TCO2: 23 mmol/L (ref 22–32)

## 2023-07-25 LAB — PROTIME-INR
INR: 1 (ref 0.8–1.2)
Prothrombin Time: 13.6 s (ref 11.4–15.2)

## 2023-07-25 MED ORDER — PREDNISONE 10 MG PO TABS: 20.0000 mg | ORAL_TABLET | Freq: Every day | ORAL | 0 refills | Status: AC

## 2023-07-25 MED ORDER — KETOROLAC TROMETHAMINE 15 MG/ML IJ SOLN
15.0000 mg | Freq: Once | INTRAMUSCULAR | Status: AC
  Administered 2023-07-25: 15 mg via INTRAVENOUS
  Filled 2023-07-25: qty 1

## 2023-07-25 MED ORDER — ONDANSETRON HCL 4 MG/2ML IJ SOLN
4.0000 mg | Freq: Once | INTRAMUSCULAR | Status: AC
  Administered 2023-07-25: 4 mg via INTRAVENOUS
  Filled 2023-07-25: qty 2

## 2023-07-25 MED ORDER — ONDANSETRON HCL 4 MG PO TABS: 4.0000 mg | ORAL_TABLET | Freq: Four times a day (QID) | ORAL | 0 refills | Status: AC

## 2023-07-25 MED ORDER — CYCLOBENZAPRINE HCL 10 MG PO TABS: 10.0000 mg | ORAL_TABLET | Freq: Two times a day (BID) | ORAL | 0 refills | Status: AC | PRN

## 2023-07-25 MED ORDER — HYDROMORPHONE HCL 1 MG/ML IJ SOLN
1.0000 mg | Freq: Once | INTRAMUSCULAR | Status: AC
  Administered 2023-07-25: 1 mg via INTRAVENOUS
  Filled 2023-07-25: qty 1

## 2023-07-25 MED ORDER — FENTANYL CITRATE PF 50 MCG/ML IJ SOSY
100.0000 ug | PREFILLED_SYRINGE | Freq: Once | INTRAMUSCULAR | Status: AC
  Administered 2023-07-25: 100 ug via INTRAVENOUS
  Filled 2023-07-25: qty 2

## 2023-07-25 MED ORDER — LIDOCAINE 5 % EX PTCH: 1.0000 | MEDICATED_PATCH | CUTANEOUS | 0 refills | Status: AC

## 2023-07-25 MED ORDER — LIDOCAINE 5 % EX PTCH
1.0000 | MEDICATED_PATCH | CUTANEOUS | Status: DC
  Administered 2023-07-25: 1 via TRANSDERMAL
  Filled 2023-07-25: qty 1

## 2023-07-25 MED ORDER — IOHEXOL 350 MG/ML SOLN
100.0000 mL | Freq: Once | INTRAVENOUS | Status: AC | PRN
  Administered 2023-07-25: 100 mL via INTRAVENOUS

## 2023-07-25 NOTE — ED Triage Notes (Addendum)
Patient BIB [facility] from home. Complaining of upper middle back pain for 2 days. Picked up his child on Sunday and had a flare up then. Rolling around on stretcher.   EMS 18G Left AC fentanyl
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
## 2023-07-25 NOTE — ED Provider Notes (Signed)
 Patient seen after prior EDP.   Patient is improved and desires DC home.   He has already established FU with PCP and Vascular (Dr. Myra Gianotti).   Strict return precautions given and understood.    Wynetta Fines, MD 07/25/23 [phone]

## 2023-07-25 NOTE — Discharge Instructions (Addendum)
Return for any problem.  ?

## 2023-08-14 ENCOUNTER — Encounter: Payer: Self-pay | Admitting: Surgery

## 2023-08-14 ENCOUNTER — Ambulatory Visit: Payer: Medicaid Other | Admitting: Surgery

## 2023-08-14 VITALS — BP 156/103 | HR 71 | Temp 98.4°F | Resp 20 | Ht 74.0 in | Wt 161.0 lb

## 2023-08-14 DIAGNOSIS — I7121 Aneurysm of the ascending aorta, without rupture: Secondary | ICD-10-CM
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
## 2023-09-14 ENCOUNTER — Other Ambulatory Visit: Payer: Self-pay

## 2023-09-14 DIAGNOSIS — Z8042 Family history of malignant neoplasm of prostate: Secondary | ICD-10-CM

## 2023-09-15 ENCOUNTER — Other Ambulatory Visit: Payer: Medicaid Other

## 2023-09-15 DIAGNOSIS — Z8042 Family history of malignant neoplasm of prostate: Secondary | ICD-10-CM

## 2023-09-16 LAB — PSA: Prostate Specific Ag, Serum: 0.8 ng/mL (ref 0.0–4.0)

## 2023-09-19 ENCOUNTER — Ambulatory Visit: Payer: Medicaid Other | Admitting: Urology

## 2023-09-20 ENCOUNTER — Ambulatory Visit: Payer: Self-pay | Admitting: Urology

## 2023-09-20 ENCOUNTER — Ambulatory Visit: Payer: Medicaid Other | Admitting: Urology

## 2023-12-07 ENCOUNTER — Telehealth: Payer: Self-pay

## 2023-12-07 NOTE — Telephone Encounter (Signed)
 Advice: -pt called, spoke to appointments stating he was having blood in his stool and believes it is his aneurysm that is leaking.   -reviewed chart.  Recent GI visit. -returned call LM

## 2024-01-02 ENCOUNTER — Ambulatory Visit: Payer: Self-pay

## 2024-01-02 DIAGNOSIS — R109 Unspecified abdominal pain: Secondary | ICD-10-CM

## 2024-01-02 NOTE — Telephone Encounter (Signed)
 Requesting new referral to GI

## 2024-01-02 NOTE — Addendum Note (Signed)
 Addended by: Jewel Venditto on: 01/02/2024 05:42 PM   Modules accepted: Orders

## 2024-01-02 NOTE — Telephone Encounter (Signed)
 FYI Only or Action Required?: Action required by provider  Patient was last seen in primary care on 03/02/2022 by Newlin, Enobong, MD. Called Nurse Triage reporting Abdominal Pain. Symptoms began several years ago. Interventions attempted: Other: had colonoscopy today and was unable to complete. Symptoms are: unchanged.  Triage Disposition: Call PCP Now (overriding See PCP Within 2 Weeks)  Patient/caregiver understands and will follow disposition?: No  Pt wanting new referral for GI d/t feels like GI today was incompetent. Offered appt with PCP today but pt doesn't want to come in for appt. He just wanting new referral so he can go thru whole process again to have colonoscopy done right.   Copied from CRM [id number]. Topic: Clinical - Red Word Triage >> Jan 02, 2024  1:14 PM Thliyah D wrote: Patient was bleeding from anus for 3 months he said they did a colonoscopy but didn't complete it. Says the bleeding stopped but the pain is still persistent. The colonscopy happened today patient is coming off anesthesia. Reason for Disposition  Abdominal pain is a chronic symptom (recurrent or ongoing AND present > 4 weeks)  Answer Assessment - Initial Assessment Questions 1. LOCATION: "Where does it hurt?"      Upper abdomen 3. ONSET: "When did the pain begin?" (Minutes, hours or days ago)      Ongoing 30 years  5. PATTERN "Does the pain come and go, or is it constant?"    - If it comes and goes: "How long does it last?" "Do you have pain now?"     (Note: Comes and goes means the pain is intermittent. It goes away completely between bouts.)    - If constant: "Is it getting better, staying the same, or getting worse?"      (Note: Constant means the pain never goes away completely; most serious pain is constant and gets worse.)      Constant  6. SEVERITY: "How bad is the pain?"  (e.g., Scale 1-10; mild, moderate, or severe)    - MILD (1-3): Doesn't interfere with normal activities, abdomen soft and not  tender to touch.     - MODERATE (4-7): Interferes with normal activities or awakens from sleep, abdomen tender to touch.     - SEVERE (8-10): Excruciating pain, doubled over, unable to do any normal activities.       Moderate  7. RECURRENT SYMPTOM: "Have you ever had this type of stomach pain before?" If Yes, ask: "When was the last time?" and "What happened that time?"      Had colonoscopy today but was unable to complete d/t Prep didn't clean out upper colon  10. OTHER SYMPTOMS: "Do you have any other symptoms?" (e.g., back pain, diarrhea, fever, urination pain, vomiting)       Pt upset and irritated  Pt states he is tired of dealing with providers who don't know what they are doing. Pt had told GI provider he was still having colored stools and proceeded to do colonoscopy anyways. Pt wanting new referral for different GI. He does not want anyone to offer medications.  Protocols used: Abdominal Pain - Male-A-AH

## 2024-01-02 NOTE — Telephone Encounter (Signed)
 Referral has been placed.

## 2024-01-03 NOTE — Telephone Encounter (Signed)
Patient was called and informed of referral being placed. 

## 2024-01-11 ENCOUNTER — Telehealth: Payer: Self-pay

## 2024-01-11 DIAGNOSIS — R109 Unspecified abdominal pain: Secondary | ICD-10-CM

## 2024-01-11 NOTE — Telephone Encounter (Signed)
 Copied from CRM [id number]. Topic: Referral - Question >> Jan 11, 2024 11:13 AM Lysbeth Sauger wrote: Reason for CRM: received a call last week and said that Dr.Newlin sent a refferal for GI and pt havent heard anything back yet. He wants to know what's the location of the place the referral is sent to.

## 2024-01-11 NOTE — Telephone Encounter (Signed)
 [place] can you tell me the status of his new GI referral that was placed.

## 2024-01-11 NOTE — Telephone Encounter (Signed)
 Routing to PCP for review.

## 2024-01-11 NOTE — Telephone Encounter (Signed)
 Copied from CRM [id number]. Topic: Referral - Question >> Jan 11, 2024 11:37 AM Hassie Lint wrote: Reason for CRM: Patient states he was referred to a GI doctor and had an endoscopy and colonoscopy. The colonoscopy was unable to be completed as his prep wasn't complete. Patient does not want to go back to the original GI doctor to be retested. Just wants to have the test redone somewhere in [place]. Doesn't want to go through the process of getting a new referral and seeing a new specialist. Would like someone to explain why he needs to go through the process again.

## 2024-01-12 NOTE — Telephone Encounter (Signed)
 I have placed another referral again.  I previously placed a referral the same way I usually do and so I am not sure why Chad Johnson was not able to see it.

## 2024-01-12 NOTE — Addendum Note (Signed)
 Addended by: Mallie Giambra on: 01/12/2024 10:22 AM   Modules accepted: Orders

## 2024-01-12 NOTE — Telephone Encounter (Signed)
 FYI

## 2024-01-15 NOTE — Telephone Encounter (Signed)
 Patient has been informed and given contact information

## 2024-01-31 ENCOUNTER — Encounter (HOSPITAL_COMMUNITY): Payer: Self-pay
